# Patient Record
Sex: Male | Born: 1945 | Hispanic: Yes | Marital: Married | Smoking: Never smoker
Health system: Southern US, Community
[De-identification: ages and names within clinical notes are randomized; demographics above are authoritative.]

## PROBLEM LIST (undated history)

## (undated) DIAGNOSIS — N400 Enlarged prostate without lower urinary tract symptoms: Secondary | ICD-10-CM

## (undated) DIAGNOSIS — R7303 Prediabetes: Secondary | ICD-10-CM

## (undated) DIAGNOSIS — R030 Elevated blood-pressure reading, without diagnosis of hypertension: Secondary | ICD-10-CM

## (undated) HISTORY — PX: KNEE ARTHROSCOPY: SHX127

## (undated) HISTORY — DX: Benign prostatic hyperplasia without lower urinary tract symptoms: N40.0

## (undated) HISTORY — DX: Elevated blood-pressure reading, without diagnosis of hypertension: R03.0

---

## 2000-07-31 ENCOUNTER — Ambulatory Visit (HOSPITAL_COMMUNITY): Admission: RE | Admit: 2000-07-31 | Discharge: 2000-07-31 | Payer: Self-pay | Admitting: *Deleted

## 2000-09-21 ENCOUNTER — Encounter: Payer: Self-pay | Admitting: *Deleted

## 2000-09-21 ENCOUNTER — Encounter: Admission: RE | Admit: 2000-09-21 | Discharge: 2000-09-21 | Payer: Self-pay | Admitting: *Deleted

## 2006-09-18 ENCOUNTER — Ambulatory Visit: Payer: Self-pay | Admitting: Family Medicine

## 2019-07-19 ENCOUNTER — Other Ambulatory Visit: Payer: Self-pay

## 2019-07-19 DIAGNOSIS — Z20822 Contact with and (suspected) exposure to covid-19: Secondary | ICD-10-CM

## 2019-07-20 LAB — NOVEL CORONAVIRUS, NAA: SARS-CoV-2, NAA: DETECTED — AB

## 2020-05-12 ENCOUNTER — Emergency Department (HOSPITAL_COMMUNITY)
Admission: EM | Admit: 2020-05-12 | Discharge: 2020-05-12 | Disposition: A | Discharge status: Home / Self Care | Payer: Self-pay | Attending: Emergency Medicine | Admitting: Emergency Medicine

## 2020-05-12 ENCOUNTER — Encounter (HOSPITAL_COMMUNITY): Payer: Self-pay | Admitting: Emergency Medicine

## 2020-05-12 ENCOUNTER — Emergency Department (HOSPITAL_COMMUNITY): Payer: Self-pay

## 2020-05-12 ENCOUNTER — Other Ambulatory Visit: Payer: Self-pay

## 2020-05-12 DIAGNOSIS — E119 Type 2 diabetes mellitus without complications: Secondary | ICD-10-CM | POA: Insufficient documentation

## 2020-05-12 DIAGNOSIS — Y9389 Activity, other specified: Secondary | ICD-10-CM | POA: Insufficient documentation

## 2020-05-12 DIAGNOSIS — M7041 Prepatellar bursitis, right knee: Secondary | ICD-10-CM | POA: Insufficient documentation

## 2020-05-12 LAB — BASIC METABOLIC PANEL
Anion gap: 6 (ref 5–15)
BUN: 19 mg/dL (ref 8–23)
CO2: 27 mmol/L (ref 22–32)
Calcium: 7.9 mg/dL — ABNORMAL LOW (ref 8.9–10.3)
Chloride: 106 mmol/L (ref 98–111)
Creatinine, Ser: 0.7 mg/dL (ref 0.61–1.24)
GFR calc Af Amer: >60 mL/min (ref >60)
GFR calc non Af Amer: >60 mL/min (ref >60)
Glucose, Bld: 238 mg/dL — ABNORMAL HIGH (ref 70–99)
Potassium: 4.5 mmol/L (ref 3.5–5.1)
Sodium: 139 mmol/L (ref 135–145)

## 2020-05-12 LAB — SYNOVIAL CELL COUNT + DIFF, W/ CRYSTALS
Crystals, Fluid: NONE SEEN
Eosinophils-Synovial: 0 % (ref 0–1)
Lymphocytes-Synovial Fld: 1 % (ref 0–20)
Monocyte-Macrophage-Synovial Fluid: 5 % — ABNORMAL LOW (ref 50–90)
Neutrophil, Synovial: 94 % — ABNORMAL HIGH (ref 0–25)
WBC, Synovial: 46500 /mm3 — ABNORMAL HIGH (ref 0–200)

## 2020-05-12 LAB — CBC WITH DIFFERENTIAL/PLATELET
Abs Immature Granulocytes: 0.08 10*3/uL — ABNORMAL HIGH (ref 0.00–0.07)
Basophils Absolute: 0 10*3/uL (ref 0.0–0.1)
Basophils Relative: 0 %
Eosinophils Absolute: 0 10*3/uL (ref 0.0–0.5)
Eosinophils Relative: 0 %
HCT: 36.5 % — ABNORMAL LOW (ref 39.0–52.0)
Hemoglobin: 11.7 g/dL — ABNORMAL LOW (ref 13.0–17.0)
Immature Granulocytes: 1 %
Lymphocytes Relative: 4 %
Lymphs Abs: 0.4 10*3/uL — ABNORMAL LOW (ref 0.7–4.0)
MCH: 30.7 pg (ref 26.0–34.0)
MCHC: 32.1 g/dL (ref 30.0–36.0)
MCV: 95.8 fL (ref 80.0–100.0)
Monocytes Absolute: 0.5 10*3/uL (ref 0.1–1.0)
Monocytes Relative: 4 %
Neutro Abs: 10.3 10*3/uL — ABNORMAL HIGH (ref 1.7–7.7)
Neutrophils Relative %: 91 %
Platelets: 186 10*3/uL (ref 150–400)
RBC: 3.81 MIL/uL — ABNORMAL LOW (ref 4.22–5.81)
RDW: 14.4 % (ref 11.5–15.5)
WBC: 11.3 10*3/uL — ABNORMAL HIGH (ref 4.0–10.5)
nRBC: 0 % (ref 0.0–0.2)

## 2020-05-12 LAB — HEMOGLOBIN A1C
Hgb A1c MFr Bld: 7.1 % — ABNORMAL HIGH (ref 4.8–5.6)
Mean Plasma Glucose: 157.07 mg/dL

## 2020-05-12 LAB — SEDIMENTATION RATE: Sed Rate: 66 mm/hr — ABNORMAL HIGH (ref 0–16)

## 2020-05-12 MED ORDER — DOXYCYCLINE HYCLATE 100 MG PO CAPS
100.0000 mg | ORAL_CAPSULE | Freq: Two times a day (BID) | ORAL | 0 refills | Status: DC
Start: 2020-05-12 — End: 2020-05-22

## 2020-05-12 MED ORDER — DOXYCYCLINE HYCLATE 100 MG PO TABS
100.0000 mg | ORAL_TABLET | Freq: Once | ORAL | Status: AC
  Administered 2020-05-12: 100 mg via ORAL
  Filled 2020-05-12: qty 1

## 2020-05-12 MED ORDER — AMOXICILLIN 500 MG PO CAPS
500.0000 mg | ORAL_CAPSULE | Freq: Three times a day (TID) | ORAL | 0 refills | Status: DC
Start: 2020-05-12 — End: 2020-05-22

## 2020-05-12 MED ORDER — METFORMIN HCL 500 MG PO TABS
500.0000 mg | ORAL_TABLET | Freq: Two times a day (BID) | ORAL | 2 refills | Status: DC
Start: 2020-05-12 — End: 2023-04-19

## 2020-05-12 MED ORDER — LIDOCAINE-EPINEPHRINE 1 %-1:100000 IJ SOLN
10.0000 mL | Freq: Once | INTRAMUSCULAR | Status: AC
  Administered 2020-05-12: 10 mL
  Filled 2020-05-12: qty 1

## 2020-05-12 NOTE — Progress Notes (Signed)
Orthopedic Tech Progress Note Patient Details:  Worth Kober 1946-07-25 [id number]  Ortho Devices Type of Ortho Device: Knee Immobilizer Ortho Device/Splint Location: lle Ortho Device/Splint Interventions: Application, Ordered   Post Interventions Patient Tolerated: Well Instructions Provided: Care of device   Denelle Capurro A Imer Foxworth 05/12/2020, 6:10 PM

## 2020-05-12 NOTE — Consult Note (Signed)
Discussed patient with ER provider, Dr. Charm Barges.  Patient with 3 days worth of what looks like a septic prepatellar bursitis.  Plan for aspiration, and send for Gram stain, culture, sensitivity, analysis, and begin on oral antibiotics to cover possible MRSA, likely doxycycline, possibly Bactrim.  Knee immobilizer, weight-bear as tolerated, and I can see him in the office tomorrow, or Friday.  Eulas Post, MD

## 2020-05-12 NOTE — ED Provider Notes (Signed)
[facility] EMERGENCY DEPARTMENT Provider Note   CSN: [id number] Arrival date & time: 05/12/20  1109     History Chief Complaint  Patient presents with  . Knee Pain    Nathaniel Parks is a 74 y.o. male.  He has no past medical history.  Complaining of right knee pain for 3 days.  No known trauma.  Says he has no pain at rest but it hurts if he walks on it a lot.  No prior history of similar swelling.  No numbness.  He said he has been using ice to it which helps.  He works in [employer] and does do a lot of walking standing and crawling around on his knees.  No fevers or chills.  The history is provided by the patient. The history is limited by a language barrier. A language interpreter was used (ipad).  Knee Pain Location:  Knee Time since incident:  3 days Injury: no   Knee location:  R knee Pain details:    Quality:  Aching   Radiates to:  Does not radiate   Severity:  Moderate   Onset quality:  Gradual   Progression:  Unchanged Chronicity:  New Dislocation: no   Foreign body present:  No foreign bodies Relieved by:  Nothing Worsened by:  Activity Ineffective treatments:  Ice Associated symptoms: swelling   Associated symptoms: no back pain, no fever and no numbness        No past medical history on file.  There are no problems to display for this patient.   History reviewed. No pertinent surgical history.     No family history on file.  Social History   Tobacco Use  . Smoking status: Never Smoker  Substance Use Topics  . Alcohol use: Not Currently  . Drug use: Never    Home Medications Prior to Admission medications   Medication Sig Start Date End Date Taking? Authorizing Provider  amoxicillin (AMOXIL) 500 MG capsule Take 1 capsule (500 mg total) by mouth 3 (three) times daily. 05/12/20   Terrilee Files, MD  doxycycline (VIBRAMYCIN) 100 MG capsule Take 1 capsule (100 mg total) by mouth 2 (two) times daily. 05/12/20   Terrilee Files, MD  metFORMIN (GLUCOPHAGE) 500 MG tablet Take 1 tablet (500 mg total) by mouth 2 (two) times daily with a meal. 05/12/20   Terrilee Files, MD    Allergies    Patient has no known allergies.  Review of Systems   Review of Systems  Constitutional: Negative for fever.  HENT: Negative for sore throat.   Eyes: Negative for visual disturbance.  Respiratory: Negative for shortness of breath.   Cardiovascular: Negative for chest pain.  Gastrointestinal: Negative for abdominal pain.  Genitourinary: Negative for dysuria.  Musculoskeletal: Negative for back pain.  Skin: Negative for rash.  Neurological: Negative for headaches.    Physical Exam Updated Vital Signs BP 126/65 (BP Location: Left Arm)   Pulse 74   Temp 98.1 F (36.7 C) (Oral)   Resp 14   Ht 5' 4.96" (1.65 m)   Wt 68 kg   SpO2 100%   BMI 24.98 kg/m   Physical Exam Vitals and nursing note reviewed.  Constitutional:      Appearance: He is well-developed.  HENT:     Head: Normocephalic and atraumatic.  Eyes:     Conjunctiva/sclera: Conjunctivae normal.  Cardiovascular:     Rate and Rhythm: Normal rate and regular rhythm.  Heart sounds: No murmur heard.   Pulmonary:     Effort: Pulmonary effort is normal. No respiratory distress.     Breath sounds: Normal breath sounds.  Abdominal:     Palpations: Abdomen is soft.     Tenderness: There is no abdominal tenderness.  Musculoskeletal:        General: Swelling and tenderness present.     Cervical back: Neck supple.     Right lower leg: Edema present.     Comments: Patient's right knee is moderately swollen.  It is erythematous and warm.  Primarily tender over the prepatellar bursa although does have some extension down the medial lateral sides.  No pain with axial loading.  Extensor mechanism intact.  Does have edema over the knee but not extending into the calf no cords appreciated.  Distal pulses and sensation intact.  Skin:    General: Skin is warm  and dry.     Capillary Refill: Capillary refill takes less than 2 seconds.  Neurological:     General: No focal deficit present.     Mental Status: He is alert.     Sensory: No sensory deficit.     Motor: No weakness.       ED Results / Procedures / Treatments   Labs (all labs ordered are listed, but only abnormal results are displayed) Labs Reviewed  BASIC METABOLIC PANEL  CBC WITH DIFFERENTIAL/PLATELET  SEDIMENTATION RATE    EKG None  Radiology DG Knee Complete 4 Views Right  Result Date: 05/12/2020 CLINICAL DATA:  Swelling without trauma EXAM: RIGHT KNEE - COMPLETE 4+ VIEW COMPARISON:  None. FINDINGS: No acute fracture or dislocation. There are mild tricompartmental degenerative changes with joint space narrowing and mild osteophyte formation. Multiple well corticated ossific densities at the posterior joint space likely reflecting fabellae versus sequela of remote prior trauma. No area of erosion or osseous destruction. No unexpected radiopaque foreign body. Soft tissue edema overlying the patella and suprapatellar area without large joint effusion. No subcutaneous air. IMPRESSION: 1. Soft tissue edema overlying the patella and suprapatellar area without large joint effusion. 2. No acute fracture or dislocation. 3. No radiographic evidence of osteomyelitis. Electronically Signed   By: Meda Klinefelter MD   On: 05/12/2020 12:16    Procedures .Joint Aspiration/Arthrocentesis  Date/Time: 05/12/2020 5:37 PM Performed by: Terrilee Files, MD Authorized by: Terrilee Files, MD   Consent:    Consent obtained:  Written   Consent given by:  Patient   Risks discussed:  Bleeding, infection and pain   Alternatives discussed:  No treatment and delayed treatment Location:    Location:  Knee   Knee:  R knee Anesthesia (see MAR for exact dosages):    Anesthesia method:  Local infiltration   Local anesthetic:  Lidocaine 2% WITH epi Procedure details:    Preparation: Patient  was prepped and draped in usual sterile fashion     Needle gauge:  18 G   Ultrasound guidance: no     Approach:  Superior   Aspirate amount:  40   Aspirate characteristics:  Cloudy   Steroid injected: no     Specimen collected: yes   Post-procedure details:    Dressing:  Gauze roll   Patient tolerance of procedure:  Tolerated well, no immediate complications Comments:     Prepatellar bursa aspiration   (including critical care time)  Medications Ordered in ED Medications  lidocaine-EPINEPHrine (XYLOCAINE W/EPI) 1 %-1:100000 (with pres) injection 10 mL (10 mLs Infiltration Given  by Other 05/12/20 1733)  doxycycline (VIBRA-TABS) tablet 100 mg (100 mg Oral Given 05/12/20 1832)    ED Course  I have reviewed the triage vital signs and the nursing notes.  Pertinent labs & imaging results that were available during my care of the patient were reviewed by me and considered in my medical decision making (see chart for details).  Clinical Course as of May 13 1018  Tue May 12, 2020  1700 D[facility]ed with Dr. [facility].  He recommended tapping the prepatellar bursa and getting out as much fluid as possible and sending it off for crystals and culture.  Doxycycline.  Knee immobilizer.  Can follow-up in the office either tomorrow or Friday.   [MB]  1806 Patient's labs coming back with an elevated glucose of 238.  Not known to be diabetic.  This is probably chronic condition although it may reflect some normal extra stress on the system with this infection.  Talked with transitions of care and they will try to get [facility].  We will start him on some Metformin.   [MB]    Clinical Course User Index [MB] Terrilee FilesButler, Nikolaos Maddocks C, MD   MDM Rules/Calculators/A&P                         This patient complains of right knee pain and swelling; this involves an extensive number of treatment Options and is a complaint that carries with it a high risk of complications  and Morbidity. The differential includes septic knee, gout, bursitis, cellulitis  I ordered, reviewed and interpreted labs, which included CBC with mildly elevated white count of 11, mildly low hemoglobin of 11.7, chemistry with elevated glucose and low calcium, no priors in system. I ordered medication doxycycline I ordered imaging studies which included x-ray right knee and I independently    visualized and interpreted imaging which showed degenerative changes and soft tissue swelling Previous records obtained and reviewed in epic, none I consulted Dr. [facility] and discussed lab and imaging findings  Critical Interventions: None  After the interventions stated above, I reevaluated the patient and found patient to be afebrile and minimally symptomatic.  Removed about 40 cc of cloudy tan fluid from prepatellar bursa.  Patient clinically not exhibiting symptoms of septic knee joint as has minimal pain with range of motion and axial loading.  Also not systemically ill.  Will cover with antibiotics and knee immobilizer with close follow-up with orthopedics tomorrow.  Also have asked social work to get him a primary care doctor appointment as he is likely new onset diabetic.  Added on A1c.  Reviewed all of this with the patient via Spanish interpreter iPad.  Return instructions discussed.   Final Clinical Impression(s) / ED Diagnoses Final diagnoses:  Prepatellar bursitis of right knee  Diabetes mellitus, new onset (HCC)    Rx / DC Orders ED Discharge Orders         Ordered    doxycycline (VIBRAMYCIN) 100 MG capsule  2 times daily     Discontinue  Reprint     05/12/20 1739    metFORMIN (GLUCOPHAGE) 500 MG tablet  2 times daily with meals     Discontinue  Reprint     05/12/20 1808    amoxicillin (AMOXIL) 500 MG capsule  3 times daily     Discontinue  Reprint     05/12/20 1855  Terrilee Files, MD 05/13/20 1024

## 2020-05-12 NOTE — ED Triage Notes (Signed)
Pt reports 3 days right knee swelling. Denies recent injury. Knee red/ hot to touch. PT ambulatory with limp.

## 2020-05-12 NOTE — Discharge Instructions (Addendum)
You were seen in the emergency department for swelling around your right knee.  Your x-ray did not show any fracture.  We drained some fluid of to send to the laboratory and you were started on antibiotics.  Please also take ibuprofen three times a day.  Elevate leg.  Call Dr. Shelba Flake office tomorrow to be seen in the clinic.  Return to the emergency department if any fever or worsening symptoms.

## 2020-05-12 NOTE — Social Work (Signed)
TOC team will reach out to [facility] in order to connect Pt with PCP. CSW also added [facility] to Pt's AVS

## 2020-05-13 ENCOUNTER — Telehealth: Payer: Self-pay

## 2020-05-13 ENCOUNTER — Encounter (HOSPITAL_COMMUNITY): Payer: Self-pay | Admitting: Emergency Medicine

## 2020-05-13 LAB — GLUCOSE, BODY FLUID OTHER: Glucose, Body Fluid Other: 45 mg/dL

## 2020-05-13 LAB — PROTEIN, BODY FLUID (OTHER): Total Protein, Body Fluid Other: 1.3 g/dL

## 2020-05-13 NOTE — Telephone Encounter (Signed)
CSW called Pt's cell phone [phone] with the assistance of Copy services employee # [phone]. CSW was able to give details of Pt's appointment at [facility] on 05/18/20 at 2:40pm

## 2020-05-14 ENCOUNTER — Other Ambulatory Visit (HOSPITAL_COMMUNITY): Admission: RE | Admit: 2020-05-14 | Payer: Self-pay | Source: Ambulatory Visit

## 2020-05-14 LAB — BODY FLUID CULTURE

## 2020-05-14 LAB — PATHOLOGIST SMEAR REVIEW

## 2020-05-16 ENCOUNTER — Telehealth: Payer: Self-pay | Admitting: Emergency Medicine

## 2020-05-16 NOTE — Telephone Encounter (Signed)
Post ED Visit - Positive Culture Follow-up  Culture report reviewed by antimicrobial stewardship pharmacist: [facility] []  , Pharm.D. []  Enzo Bi, Pharm.D., BCPS AQ-ID []  , Pharm.D., BCPS []  Celedonio Miyamoto, Pharm.D., BCPS []  [place], Garvin Fila.D., BCPS, AAHIVP []  , Pharm.D., BCPS, AAHIVP []  Georgina Pillion, PharmD, BCPS []  , PharmD, BCPS []  [place], PharmD, BCPS [x]  [place], PharmD []  , PharmD, BCPS []  Estella Husk, PharmD  Pharmacy Team []  Lysle Pearl, PharmD []  , PharmD []  Phillips Climes, PharmD []  , Rph []  Agapito Games) , PharmD []  Leia Alf, PharmD []  , PharmD []  Mervyn Gay, PharmD []  , PharmD []  Vinnie Level, PharmD []  Wonda Olds, PharmD []  , PharmD []  Len Childs, PharmD   Positive body fluid  culture Treated with Doxycycline and Amoxicillin, organism sensitive to the same and no further patient follow-up is required at this time.  Nechemia Chiappetta 05/16/2020, 10:36 AM

## 2020-05-18 ENCOUNTER — Inpatient Hospital Stay (HOSPITAL_COMMUNITY)
Admission: RE | Admit: 2020-05-18 | Discharge: 2020-05-22 | DRG: 501 | Disposition: A | Discharge status: Home / Self Care | Payer: Self-pay | Source: Ambulatory Visit | Attending: Orthopedic Surgery | Admitting: Orthopedic Surgery

## 2020-05-18 ENCOUNTER — Other Ambulatory Visit: Payer: Self-pay

## 2020-05-18 ENCOUNTER — Inpatient Hospital Stay (INDEPENDENT_AMBULATORY_CARE_PROVIDER_SITE_OTHER): Payer: Self-pay | Admitting: Primary Care

## 2020-05-18 DIAGNOSIS — M71161 Other infective bursitis, right knee: Principal | ICD-10-CM | POA: Diagnosis present

## 2020-05-18 DIAGNOSIS — E119 Type 2 diabetes mellitus without complications: Secondary | ICD-10-CM | POA: Diagnosis present

## 2020-05-18 DIAGNOSIS — Z794 Long term (current) use of insulin: Secondary | ICD-10-CM

## 2020-05-18 DIAGNOSIS — L03115 Cellulitis of right lower limb: Secondary | ICD-10-CM | POA: Diagnosis present

## 2020-05-18 DIAGNOSIS — B9561 Methicillin susceptible Staphylococcus aureus infection as the cause of diseases classified elsewhere: Secondary | ICD-10-CM | POA: Diagnosis present

## 2020-05-18 DIAGNOSIS — Z20822 Contact with and (suspected) exposure to covid-19: Secondary | ICD-10-CM | POA: Diagnosis present

## 2020-05-18 HISTORY — DX: Prediabetes: R73.03

## 2020-05-18 LAB — BASIC METABOLIC PANEL
Anion gap: 9 (ref 5–15)
BUN: 29 mg/dL — ABNORMAL HIGH (ref 8–23)
CO2: 26 mmol/L (ref 22–32)
Calcium: 8.6 mg/dL — ABNORMAL LOW (ref 8.9–10.3)
Chloride: 99 mmol/L (ref 98–111)
Creatinine, Ser: 1.27 mg/dL — ABNORMAL HIGH (ref 0.61–1.24)
GFR calc Af Amer: >60 mL/min (ref >60)
GFR calc non Af Amer: 55 mL/min — ABNORMAL LOW (ref >60)
Glucose, Bld: 194 mg/dL — ABNORMAL HIGH (ref 70–99)
Potassium: 5.1 mmol/L (ref 3.5–5.1)
Sodium: 134 mmol/L — ABNORMAL LOW (ref 135–145)

## 2020-05-18 LAB — CBC
HCT: 37.4 % — ABNORMAL LOW (ref 39.0–52.0)
Hemoglobin: 12.1 g/dL — ABNORMAL LOW (ref 13.0–17.0)
MCH: 30.3 pg (ref 26.0–34.0)
MCHC: 32.4 g/dL (ref 30.0–36.0)
MCV: 93.7 fL (ref 80.0–100.0)
Platelets: 294 10*3/uL (ref 150–400)
RBC: 3.99 MIL/uL — ABNORMAL LOW (ref 4.22–5.81)
RDW: 13.6 % (ref 11.5–15.5)
WBC: 8.7 10*3/uL (ref 4.0–10.5)
nRBC: 0 % (ref 0.0–0.2)

## 2020-05-18 LAB — SURGICAL PCR SCREEN
MRSA, PCR: NEGATIVE
Staphylococcus aureus: NEGATIVE

## 2020-05-18 LAB — SARS CORONAVIRUS 2 BY RT PCR (HOSPITAL ORDER, PERFORMED IN ~~LOC~~ HOSPITAL LAB): SARS Coronavirus 2: NEGATIVE

## 2020-05-18 MED ORDER — HYDROCODONE-ACETAMINOPHEN 7.5-325 MG PO TABS
1.0000 | ORAL_TABLET | ORAL | Status: DC | PRN
  Administered 2020-05-19: 1 via ORAL
  Filled 2020-05-18: qty 1

## 2020-05-18 MED ORDER — CEFAZOLIN SODIUM-DEXTROSE 2-4 GM/100ML-% IV SOLN
2.0000 g | INTRAVENOUS | Status: AC
  Administered 2020-05-19: 2 g via INTRAVENOUS
  Filled 2020-05-18: qty 100

## 2020-05-18 MED ORDER — ASPIRIN 81 MG PO CHEW
81.0000 mg | CHEWABLE_TABLET | Freq: Two times a day (BID) | ORAL | Status: DC
  Administered 2020-05-18 – 2020-05-22 (×8): 81 mg via ORAL
  Filled 2020-05-18 (×8): qty 1

## 2020-05-18 MED ORDER — ENSURE PRE-SURGERY PO LIQD
296.0000 mL | Freq: Once | ORAL | Status: AC
  Administered 2020-05-19: 296 mL via ORAL
  Filled 2020-05-18: qty 296

## 2020-05-18 MED ORDER — CHLORHEXIDINE GLUCONATE 4 % EX LIQD
60.0000 mL | Freq: Once | CUTANEOUS | Status: AC
  Administered 2020-05-19: 4 via TOPICAL
  Filled 2020-05-18: qty 60

## 2020-05-18 MED ORDER — MORPHINE SULFATE (PF) 2 MG/ML IV SOLN: 0.5000 mg | INTRAVENOUS | Status: DC | PRN

## 2020-05-18 MED ORDER — POTASSIUM CHLORIDE IN NACL 20-0.45 MEQ/L-% IV SOLN
INTRAVENOUS | Status: DC
  Filled 2020-05-18 (×3): qty 1000

## 2020-05-18 MED ORDER — DIPHENHYDRAMINE HCL 12.5 MG/5ML PO ELIX: 12.5000 mg | ORAL_SOLUTION | ORAL | Status: DC | PRN

## 2020-05-18 MED ORDER — ONDANSETRON HCL 4 MG PO TABS: 4.0000 mg | ORAL_TABLET | Freq: Four times a day (QID) | ORAL | Status: DC | PRN

## 2020-05-18 MED ORDER — ACETAMINOPHEN 500 MG PO TABS
1000.0000 mg | ORAL_TABLET | Freq: Once | ORAL | Status: AC
  Administered 2020-05-18: 1000 mg via ORAL
  Filled 2020-05-18: qty 2

## 2020-05-18 MED ORDER — METHOCARBAMOL 1000 MG/10ML IJ SOLN
500.0000 mg | Freq: Four times a day (QID) | INTRAVENOUS | Status: DC | PRN
  Filled 2020-05-18: qty 5

## 2020-05-18 MED ORDER — MUPIROCIN 2 % EX OINT
1.0000 "application " | TOPICAL_OINTMENT | Freq: Two times a day (BID) | CUTANEOUS | Status: DC
  Administered 2020-05-18 – 2020-05-20 (×3): 1 via NASAL
  Filled 2020-05-18: qty 22

## 2020-05-18 MED ORDER — MAGNESIUM CITRATE PO SOLN: 1.0000 | Freq: Once | ORAL | Status: DC | PRN

## 2020-05-18 MED ORDER — ONDANSETRON HCL 4 MG/2ML IJ SOLN
4.0000 mg | Freq: Four times a day (QID) | INTRAMUSCULAR | Status: DC | PRN
  Administered 2020-05-19: 4 mg via INTRAVENOUS

## 2020-05-18 MED ORDER — VANCOMYCIN HCL IN DEXTROSE 1-5 GM/200ML-% IV SOLN
1000.0000 mg | Freq: Once | INTRAVENOUS | Status: AC
  Administered 2020-05-18: 1000 mg via INTRAVENOUS
  Filled 2020-05-18: qty 200

## 2020-05-18 MED ORDER — METHOCARBAMOL 500 MG PO TABS: 500.0000 mg | ORAL_TABLET | Freq: Four times a day (QID) | ORAL | Status: DC | PRN

## 2020-05-18 MED ORDER — SENNOSIDES-DOCUSATE SODIUM 8.6-50 MG PO TABS: 1.0000 | ORAL_TABLET | Freq: Every evening | ORAL | Status: DC | PRN

## 2020-05-18 MED ORDER — HYDROCODONE-ACETAMINOPHEN 5-325 MG PO TABS: 1.0000 | ORAL_TABLET | ORAL | Status: DC | PRN

## 2020-05-18 MED ORDER — BISACODYL 5 MG PO TBEC: 5.0000 mg | DELAYED_RELEASE_TABLET | Freq: Every day | ORAL | Status: DC | PRN

## 2020-05-18 MED ORDER — VANCOMYCIN HCL 750 MG/150ML IV SOLN
750.0000 mg | Freq: Two times a day (BID) | INTRAVENOUS | Status: DC
  Administered 2020-05-18: 750 mg via INTRAVENOUS
  Filled 2020-05-18 (×2): qty 150

## 2020-05-18 MED ORDER — POVIDONE-IODINE 10 % EX SWAB
2.0000 "application " | Freq: Once | CUTANEOUS | Status: AC
  Administered 2020-05-19: 2 via TOPICAL

## 2020-05-18 MED ORDER — ACETAMINOPHEN 325 MG PO TABS: 325.0000 mg | ORAL_TABLET | Freq: Four times a day (QID) | ORAL | Status: DC | PRN

## 2020-05-18 NOTE — Plan of Care (Signed)
?  Problem: Clinical Measurements: ?Goal: Ability to avoid or minimize complications of infection will improve ?Outcome: Progressing ?  ?Problem: Skin Integrity: ?Goal: Skin integrity will improve ?Outcome: Progressing ?  ?

## 2020-05-18 NOTE — H&P (Signed)
H&P  Chief Complaint: Right knee pain  HPI: Nathaniel Parks is a 74 y.o. male with no past medical history presented to the ED on 05/12/20 with 3 day history of right knee pain and swelling. Orthopedics was consulted, knee was aspirated and sent for gram stain, culture, sensitivity, and analysis. He was discharged on doxycycline and told to follow up in our office. He came into our office on 7/21 for follow-up where a bedside prepatellar bursectomy was performed. Patient was continued on doxycycline and bactrim was added. He followed up 7/23 showing improvement, however, followed up today in the office with worsening symptoms. Patient states he has no pain at rest but increased 10/10 pain with any walking, only decreased with tramadol. He has also noticed increased redness form his mid thigh to his ankle and increased swelling in his lower leg and ankle, improved with elevation.   He denies fever, chills, abdominal pain, nausea, vomiting, chest pain, or shortness of breath.   Patient was direct admitted to the hospital for IV antibiotics and irrigation and debridement to be performed tomorrow.    No past medical history on file. No past surgical history on file. Social History   Socioeconomic History  . Marital status: Married    Spouse name: Not on file  . Number of children: Not on file  . Years of education: Not on file  . Highest education level: Not on file  Occupational History  . Not on file  Tobacco Use  . Smoking status: Never Smoker  Substance and Sexual Activity  . Alcohol use: Not Currently  . Drug use: Never  . Sexual activity: Not on file  Other Topics Concern  . Not on file  Social History Narrative  . Not on file   Social Determinants of Health   Financial Resource Strain:   . Difficulty of Paying Living Expenses:   Food Insecurity:   . Worried About Programme researcher, broadcasting/film/video in the Last Year:   . Barista in the Last Year:   Transportation Needs:   . Automotive engineer (Medical):   Marland Kitchen Lack of Transportation (Non-Medical):   Physical Activity:   . Days of Exercise per Week:   . Minutes of Exercise per Session:   Stress:   . Feeling of Stress :   Social Connections:   . Frequency of Communication with Friends and Family:   . Frequency of Social Gatherings with Friends and Family:   . Attends Religious Services:   . Active Member of Clubs or Organizations:   . Attends Banker Meetings:   Marland Kitchen Marital Status:    No family history on file. No Known Allergies Prior to Admission medications   Medication Sig Start Date End Date Taking? Authorizing Provider  BACTRIM DS 800-160 MG tablet Take 1 tablet by mouth 2 (two) times daily. 05/13/20  Yes [provider]  doxycycline (VIBRAMYCIN) 100 MG capsule Take 1 capsule (100 mg total) by mouth 2 (two) times daily. 05/12/20  Yes Terrilee Files, MD  traMADol (ULTRAM) 50 MG tablet Take 50 mg by mouth 3 (three) times daily as needed for pain. 05/15/20  Yes [provider]  amoxicillin (AMOXIL) 500 MG capsule Take 1 capsule (500 mg total) by mouth 3 (three) times daily. Patient not taking: Reported on 05/18/2020 05/12/20   Terrilee Files, MD  metFORMIN (GLUCOPHAGE) 500 MG tablet Take 1 tablet (500 mg total) by mouth 2 (two) times daily with a meal.  05/12/20   Terrilee Files, MD     Positive ROS: All other systems have been reviewed and were otherwise negative with the exception of those mentioned in the HPI and as above.  Physical Exam: General: Alert, no acute distress Cardiovascular: No pedal edema Respiratory: No cyanosis, no use of accessory musculature GI: No organomegaly, abdomen is soft and non-tender Skin: Mottled erythematous skin extending from mid thigh to ankle on RLE.  Neurologic: Sensation intact distally Psychiatric: Patient is competent for consent with normal mood and affect Lymphatic: No axillary or cervical lymphadenopathy  MUSCULOSKELETAL: Open  wound over right knee from previous open bursal irrigation and debridement with continued drainage. RLE erythematous from mid thigh to ankle. Moderate edema to RLE, especially at ankle. EHL and FHL intact. Able to move all toes of right foot. Distal sensation intact.  Previous R knee synovial fluid culture positive for S. Aureus   Assessment: R knee septic prepatellar bursitis - S. Aureus  Plan: - NPO after midnight, plan for R knee bursectomy/irrigation and debridement with Dr. Dion Saucier tomorrow afternoon - continue current pain control regimen - IV vancomycin per pharmacy consult for S. Aureus septic bursitis - patient will need to be discharged on PICC line for IV antibiotics  Plan for Procedure(s): KNEE BURSECTOMY IRRIGATION AND DEBRIDEMENT KNEE  The risks benefits and alternatives were discussed with the patient including but not limited to the risks of nonoperative treatment, versus surgical intervention including infection, bleeding, nerve injury,  blood clots, cardiopulmonary complications, morbidity, mortality, among others, and they were willing to proceed.   Anticipated LOS equal to or greater than 2 midnights due to - Age 29 and older with one or more of the following:  - Expected need for hospital services (PT, OT, Nursing) required for safe discharge  - Need for IV antibiotics   Armida Sans, PA-C   05/18/2020 2:11 PM

## 2020-05-18 NOTE — Progress Notes (Signed)
In pt room with interpreter line to explain surgical procedure. Pt states he wishes to speak with MD before signing consent. Unsigned consent in pt chart

## 2020-05-18 NOTE — Progress Notes (Signed)
Pharmacy Antibiotic Note  Nathaniel Parks is a 74 y.o. male admitted on 05/18/2020 with septic bursitis .  Pharmacy has been consulted for Vancomycin dosing.  Surgery planned for tomorrow  Plan: Vancomycin 1 gram iv x 1 now then 750 mg iv Q 12  Height: 5' 4.96" (165 cm) Weight: 72.9 kg (160 lb 11.5 oz) IBW/kg (Calculated) : 61.41  Temp (24hrs), Avg:98.6 F (37 C), Min:98.6 F (37 C), Max:98.6 F (37 C)  Recent Labs  Lab 05/12/20 1702  WBC 11.3*  CREATININE 0.70    Estimated Creatinine Clearance: 70.4 mL/min (by C-G formula based on SCr of 0.7 mg/dL).    No Known Allergies   Thank you  Okey Regal, PharmD  05/18/2020 2:23 PM

## 2020-05-18 NOTE — Plan of Care (Signed)
  Problem: Clinical Measurements: Goal: Ability to avoid or minimize complications of infection will improve 05/18/2020 1441 by Marland Mcalpine, RN Outcome: Progressing 05/18/2020 1441 by Marland Mcalpine, RN Outcome: Progressing   Problem: Skin Integrity: Goal: Skin integrity will improve 05/18/2020 1441 by Marland Mcalpine, RN Outcome: Progressing 05/18/2020 1441 by Marland Mcalpine, RN Outcome: Progressing

## 2020-05-19 ENCOUNTER — Encounter (HOSPITAL_COMMUNITY): Payer: Self-pay | Admitting: Orthopedic Surgery

## 2020-05-19 ENCOUNTER — Inpatient Hospital Stay (HOSPITAL_COMMUNITY): Payer: Self-pay | Admitting: Anesthesiology

## 2020-05-19 ENCOUNTER — Encounter (HOSPITAL_COMMUNITY)
Admission: RE | Disposition: A | Discharge status: Home / Self Care | Payer: Self-pay | Source: Ambulatory Visit | Attending: Orthopedic Surgery

## 2020-05-19 HISTORY — PX: KNEE BURSECTOMY: SHX5882

## 2020-05-19 HISTORY — PX: IRRIGATION AND DEBRIDEMENT KNEE: SHX5185

## 2020-05-19 LAB — GLUCOSE, CAPILLARY
Glucose-Capillary: 169 mg/dL — ABNORMAL HIGH (ref 70–99)
Glucose-Capillary: 425 mg/dL — ABNORMAL HIGH (ref 70–99)
Glucose-Capillary: 328 mg/dL — ABNORMAL HIGH (ref 70–99)
Glucose-Capillary: 202 mg/dL — ABNORMAL HIGH (ref 70–99)

## 2020-05-19 LAB — HEMOGLOBIN A1C
Hgb A1c MFr Bld: 7.7 % — ABNORMAL HIGH (ref 4.8–5.6)
Mean Plasma Glucose: 174.29 mg/dL

## 2020-05-19 SURGERY — BURSECTOMY, KNEE
Anesthesia: General | Site: Knee | Laterality: Right

## 2020-05-19 MED ORDER — BISACODYL 10 MG RE SUPP: 10.0000 mg | Freq: Every day | RECTAL | Status: DC | PRN

## 2020-05-19 MED ORDER — MORPHINE SULFATE (PF) 2 MG/ML IV SOLN
0.5000 mg | INTRAVENOUS | Status: DC | PRN
  Administered 2020-05-21 (×2): 1 mg via INTRAVENOUS
  Filled 2020-05-19 (×2): qty 1

## 2020-05-19 MED ORDER — METOCLOPRAMIDE HCL 5 MG PO TABS: 5.0000 mg | ORAL_TABLET | Freq: Three times a day (TID) | ORAL | Status: DC | PRN

## 2020-05-19 MED ORDER — HYDROCODONE-ACETAMINOPHEN 5-325 MG PO TABS
1.0000 | ORAL_TABLET | ORAL | Status: DC | PRN
  Administered 2020-05-21 (×2): 2 via ORAL
  Administered 2020-05-22 (×2): 1 via ORAL
  Filled 2020-05-19: qty 1
  Filled 2020-05-19 (×2): qty 2
  Filled 2020-05-19: qty 1

## 2020-05-19 MED ORDER — LIDOCAINE HCL (CARDIAC) PF 100 MG/5ML IV SOSY
PREFILLED_SYRINGE | INTRAVENOUS | Status: DC | PRN
  Administered 2020-05-19: 60 mg via INTRAVENOUS

## 2020-05-19 MED ORDER — METHOCARBAMOL 500 MG PO TABS
500.0000 mg | ORAL_TABLET | Freq: Four times a day (QID) | ORAL | Status: DC | PRN
  Administered 2020-05-21: 500 mg via ORAL
  Filled 2020-05-19: qty 1

## 2020-05-19 MED ORDER — INSULIN ASPART 100 UNIT/ML ~~LOC~~ SOLN
0.0000 [IU] | Freq: Three times a day (TID) | SUBCUTANEOUS | Status: DC
  Administered 2020-05-19: 10 [IU] via SUBCUTANEOUS
  Administered 2020-05-20: 3 [IU] via SUBCUTANEOUS
  Administered 2020-05-20: 2 [IU] via SUBCUTANEOUS
  Administered 2020-05-20: 3 [IU] via SUBCUTANEOUS
  Administered 2020-05-21 (×2): 2 [IU] via SUBCUTANEOUS
  Administered 2020-05-21: 5 [IU] via SUBCUTANEOUS
  Administered 2020-05-22: 3 [IU] via SUBCUTANEOUS

## 2020-05-19 MED ORDER — POTASSIUM CHLORIDE IN NACL 20-0.45 MEQ/L-% IV SOLN
INTRAVENOUS | Status: DC
  Filled 2020-05-19 (×5): qty 1000

## 2020-05-19 MED ORDER — MIDAZOLAM HCL 2 MG/2ML IJ SOLN
INTRAMUSCULAR | Status: AC
  Filled 2020-05-19: qty 2

## 2020-05-19 MED ORDER — HYDROCODONE-ACETAMINOPHEN 7.5-325 MG PO TABS: 1.0000 | ORAL_TABLET | ORAL | Status: DC | PRN

## 2020-05-19 MED ORDER — DEXAMETHASONE SODIUM PHOSPHATE 10 MG/ML IJ SOLN
INTRAMUSCULAR | Status: DC | PRN
  Administered 2020-05-19: 10 mg via INTRAVENOUS

## 2020-05-19 MED ORDER — METHOCARBAMOL 1000 MG/10ML IJ SOLN
500.0000 mg | Freq: Four times a day (QID) | INTRAVENOUS | Status: DC | PRN
  Filled 2020-05-19: qty 5

## 2020-05-19 MED ORDER — POLYETHYLENE GLYCOL 3350 17 G PO PACK
17.0000 g | PACK | Freq: Every day | ORAL | Status: DC | PRN
  Administered 2020-05-20 – 2020-05-22 (×2): 17 g via ORAL
  Filled 2020-05-19 (×2): qty 1

## 2020-05-19 MED ORDER — ACETAMINOPHEN 325 MG PO TABS: 325.0000 mg | ORAL_TABLET | Freq: Four times a day (QID) | ORAL | Status: DC | PRN

## 2020-05-19 MED ORDER — SENNA 8.6 MG PO TABS
1.0000 | ORAL_TABLET | Freq: Two times a day (BID) | ORAL | Status: DC
  Administered 2020-05-19 – 2020-05-22 (×6): 8.6 mg via ORAL
  Filled 2020-05-19 (×6): qty 1

## 2020-05-19 MED ORDER — LIDOCAINE 2% (20 MG/ML) 5 ML SYRINGE
INTRAMUSCULAR | Status: AC
  Filled 2020-05-19: qty 5

## 2020-05-19 MED ORDER — FENTANYL CITRATE (PF) 100 MCG/2ML IJ SOLN
INTRAMUSCULAR | Status: AC
  Filled 2020-05-19: qty 2

## 2020-05-19 MED ORDER — ONDANSETRON HCL 4 MG PO TABS: 4.0000 mg | ORAL_TABLET | Freq: Four times a day (QID) | ORAL | Status: DC | PRN

## 2020-05-19 MED ORDER — ONDANSETRON HCL 4 MG/2ML IJ SOLN: 4.0000 mg | Freq: Four times a day (QID) | INTRAMUSCULAR | Status: DC | PRN

## 2020-05-19 MED ORDER — METOCLOPRAMIDE HCL 5 MG/ML IJ SOLN: 5.0000 mg | Freq: Three times a day (TID) | INTRAMUSCULAR | Status: DC | PRN

## 2020-05-19 MED ORDER — OXYCODONE HCL 5 MG PO TABS: 5.0000 mg | ORAL_TABLET | Freq: Once | ORAL | Status: DC | PRN

## 2020-05-19 MED ORDER — INSULIN ASPART 100 UNIT/ML ~~LOC~~ SOLN
10.0000 [IU] | Freq: Once | SUBCUTANEOUS | Status: AC
  Administered 2020-05-19: 10 [IU] via SUBCUTANEOUS

## 2020-05-19 MED ORDER — CHLORHEXIDINE GLUCONATE 0.12 % MT SOLN: 15.0000 mL | Freq: Once | OROMUCOSAL | Status: AC

## 2020-05-19 MED ORDER — DOCUSATE SODIUM 100 MG PO CAPS
100.0000 mg | ORAL_CAPSULE | Freq: Two times a day (BID) | ORAL | Status: DC
  Administered 2020-05-19 – 2020-05-20 (×3): 100 mg via ORAL
  Filled 2020-05-19 (×3): qty 1

## 2020-05-19 MED ORDER — PROPOFOL 10 MG/ML IV BOLUS
INTRAVENOUS | Status: DC | PRN
  Administered 2020-05-19: 150 mg via INTRAVENOUS

## 2020-05-19 MED ORDER — CHLORHEXIDINE GLUCONATE 0.12 % MT SOLN
OROMUCOSAL | Status: AC
  Administered 2020-05-19: 15 mL via OROMUCOSAL
  Filled 2020-05-19: qty 15

## 2020-05-19 MED ORDER — FENTANYL CITRATE (PF) 250 MCG/5ML IJ SOLN
INTRAMUSCULAR | Status: AC
  Filled 2020-05-19: qty 5

## 2020-05-19 MED ORDER — PHENYLEPHRINE HCL (PRESSORS) 10 MG/ML IV SOLN
INTRAVENOUS | Status: DC | PRN
  Administered 2020-05-19 (×2): 80 ug via INTRAVENOUS

## 2020-05-19 MED ORDER — ORAL CARE MOUTH RINSE: 15.0000 mL | Freq: Once | OROMUCOSAL | Status: AC

## 2020-05-19 MED ORDER — MIDAZOLAM HCL 5 MG/5ML IJ SOLN
INTRAMUSCULAR | Status: DC | PRN
  Administered 2020-05-19: 1 mg via INTRAVENOUS

## 2020-05-19 MED ORDER — PROPOFOL 1000 MG/100ML IV EMUL
INTRAVENOUS | Status: AC
  Filled 2020-05-19: qty 100

## 2020-05-19 MED ORDER — KETOROLAC TROMETHAMINE 15 MG/ML IJ SOLN
7.5000 mg | Freq: Four times a day (QID) | INTRAMUSCULAR | Status: AC
  Administered 2020-05-19 – 2020-05-20 (×4): 7.5 mg via INTRAVENOUS
  Filled 2020-05-19 (×4): qty 1

## 2020-05-19 MED ORDER — DIPHENHYDRAMINE HCL 12.5 MG/5ML PO ELIX: 12.5000 mg | ORAL_SOLUTION | ORAL | Status: DC | PRN

## 2020-05-19 MED ORDER — MAGNESIUM CITRATE PO SOLN: 1.0000 | Freq: Once | ORAL | Status: DC | PRN

## 2020-05-19 MED ORDER — CEFAZOLIN SODIUM-DEXTROSE 1-4 GM/50ML-% IV SOLN
1.0000 g | Freq: Three times a day (TID) | INTRAVENOUS | Status: DC
  Filled 2020-05-19: qty 50

## 2020-05-19 MED ORDER — METFORMIN HCL 500 MG PO TABS
500.0000 mg | ORAL_TABLET | Freq: Two times a day (BID) | ORAL | Status: DC
  Administered 2020-05-19 – 2020-05-22 (×7): 500 mg via ORAL
  Filled 2020-05-19 (×7): qty 1

## 2020-05-19 MED ORDER — PHENYLEPHRINE 40 MCG/ML (10ML) SYRINGE FOR IV PUSH (FOR BLOOD PRESSURE SUPPORT)
PREFILLED_SYRINGE | INTRAVENOUS | Status: AC
  Filled 2020-05-19: qty 20

## 2020-05-19 MED ORDER — FENTANYL CITRATE (PF) 100 MCG/2ML IJ SOLN
25.0000 ug | INTRAMUSCULAR | Status: DC | PRN
  Administered 2020-05-19 (×3): 50 ug via INTRAVENOUS

## 2020-05-19 MED ORDER — LACTATED RINGERS IV SOLN: INTRAVENOUS | Status: DC

## 2020-05-19 MED ORDER — SODIUM CHLORIDE 0.9 % IR SOLN
Status: DC | PRN
  Administered 2020-05-19: 1000 mL
  Administered 2020-05-19 (×3): 3000 mL

## 2020-05-19 MED ORDER — FENTANYL CITRATE (PF) 250 MCG/5ML IJ SOLN
INTRAMUSCULAR | Status: DC | PRN
  Administered 2020-05-19 (×3): 50 ug via INTRAVENOUS

## 2020-05-19 MED ORDER — CEFAZOLIN SODIUM-DEXTROSE 2-4 GM/100ML-% IV SOLN
2.0000 g | Freq: Four times a day (QID) | INTRAVENOUS | Status: AC
  Administered 2020-05-19 – 2020-05-20 (×3): 2 g via INTRAVENOUS
  Filled 2020-05-19 (×3): qty 100

## 2020-05-19 MED ORDER — OXYCODONE HCL 5 MG/5ML PO SOLN: 5.0000 mg | Freq: Once | ORAL | Status: DC | PRN

## 2020-05-19 SURGICAL SUPPLY — 41 items
BNDG COHESIVE 4X5 TAN STRL (GAUZE/BANDAGES/DRESSINGS) ×2 IMPLANT
BNDG ELASTIC 4X5.8 VLCR STR LF (GAUZE/BANDAGES/DRESSINGS) ×2 IMPLANT
BNDG ELASTIC 6X5.8 VLCR STR LF (GAUZE/BANDAGES/DRESSINGS) ×2 IMPLANT
BNDG GAUZE ELAST 4 BULKY (GAUZE/BANDAGES/DRESSINGS) ×2 IMPLANT
BOOTCOVER CLEANROOM LRG (PROTECTIVE WEAR) ×4 IMPLANT
COVER SURGICAL LIGHT HANDLE (MISCELLANEOUS) ×2 IMPLANT
COVER WAND RF STERILE (DRAPES) ×2 IMPLANT
CUFF TOURN SGL QUICK 34 (TOURNIQUET CUFF)
CUFF TRNQT CYL 34X4.125X (TOURNIQUET CUFF) IMPLANT
DRSG MEPITEL 4X7.2 (GAUZE/BANDAGES/DRESSINGS) ×1 IMPLANT
DRSG VAC ATS SM SENSATRAC (GAUZE/BANDAGES/DRESSINGS) ×1 IMPLANT
DURAPREP 26ML APPLICATOR (WOUND CARE) ×2 IMPLANT
ELECT REM PT RETURN 9FT ADLT (ELECTROSURGICAL)
ELECTRODE REM PT RTRN 9FT ADLT (ELECTROSURGICAL) IMPLANT
EVACUATOR 1/8 PVC DRAIN (DRAIN) IMPLANT
GAUZE SPONGE 4X4 12PLY STRL (GAUZE/BANDAGES/DRESSINGS) ×2 IMPLANT
GAUZE XEROFORM 1X8 LF (GAUZE/BANDAGES/DRESSINGS) ×2 IMPLANT
GLOVE BIOGEL PI ORTHO PRO SZ8 (GLOVE) ×1
GLOVE ORTHO TXT STRL SZ7.5 (GLOVE) ×2 IMPLANT
GLOVE PI ORTHO PRO STRL SZ8 (GLOVE) ×1 IMPLANT
GLOVE SURG ORTHO 8.0 STRL STRW (GLOVE) ×4 IMPLANT
GOWN STRL REUS W/ TWL LRG LVL3 (GOWN DISPOSABLE) IMPLANT
GOWN STRL REUS W/TWL LRG LVL3 (GOWN DISPOSABLE)
HANDPIECE INTERPULSE COAX TIP (DISPOSABLE)
KIT BASIN OR (CUSTOM PROCEDURE TRAY) ×2 IMPLANT
KIT TURNOVER KIT B (KITS) ×2 IMPLANT
MANIFOLD NEPTUNE II (INSTRUMENTS) ×2 IMPLANT
NS IRRIG 1000ML POUR BTL (IV SOLUTION) ×2 IMPLANT
PACK ORTHO EXTREMITY (CUSTOM PROCEDURE TRAY) ×2 IMPLANT
PAD ARMBOARD 7.5X6 YLW CONV (MISCELLANEOUS) ×4 IMPLANT
SET HNDPC FAN SPRY TIP SCT (DISPOSABLE) IMPLANT
SPONGE LAP 18X18 RF (DISPOSABLE) ×2 IMPLANT
STOCKINETTE IMPERVIOUS 9X36 MD (GAUZE/BANDAGES/DRESSINGS) ×2 IMPLANT
SUT ETHILON 3 0 PS 1 (SUTURE) IMPLANT
SWAB CULTURE ESWAB REG 1ML (MISCELLANEOUS) IMPLANT
TOWEL GREEN STERILE (TOWEL DISPOSABLE) ×2 IMPLANT
TOWEL GREEN STERILE FF (TOWEL DISPOSABLE) ×2 IMPLANT
TUBE CONNECTING 12X1/4 (SUCTIONS) ×2 IMPLANT
UNDERPAD 30X36 HEAVY ABSORB (UNDERPADS AND DIAPERS) ×2 IMPLANT
WATER STERILE IRR 1000ML POUR (IV SOLUTION) ×2 IMPLANT
YANKAUER SUCT BULB TIP NO VENT (SUCTIONS) ×2 IMPLANT

## 2020-05-19 NOTE — Progress Notes (Signed)
Used [employer] ID [phone] via [employer] in [place] to get patient ready for surgery.

## 2020-05-19 NOTE — Op Note (Signed)
05/19/2020  12:49 PM  PATIENT:  Nathaniel Parks    PRE-OPERATIVE DIAGNOSIS: Right knee septic prepatellar bursitis  POST-OPERATIVE DIAGNOSIS:  Same  PROCEDURE: Right knee excisional debridement, abscess, prepatellar bursa, with irrigation and debridement septic prepatellar bursitis with placement of wound VAC  SURGEON:  Eulas Post, MD  PHYSICIAN ASSISTANT: Janine Ores, PA-C, present and scrubbed throughout the case, critical for completion in a timely fashion, and for retraction, instrumentation, and closure.  ANESTHESIA:   General  PREOPERATIVE INDICATIONS:  Nathaniel Parks is a  74 y.o. male with a diagnosis of RIGHT KNEE INFECTION, SEPTIC BURSITIS who failed conservative measures and elected for surgical management.    The risks benefits and alternatives were discussed with the patient preoperatively including but not limited to the risks of infection, bleeding, nerve injury, cardiopulmonary complications, the need for revision surgery, among others, and the patient was willing to proceed.  ESTIMATED BLOOD LOSS: 75 mL  OPERATIVE IMPLANTS: Wound vac, small  OPERATIVE FINDINGS: Substantial purulence in the superolateral aspect, still within the retained bursa.  The infection did not appear to track into the joint, but did go right down to the patellar tendon.  OPERATIVE PROCEDURE: The patient was brought to the operating room and placed in the supine position.  He was on an antibiotic regimen already.  The right lower extremity was prepped and draped in usual sterile fashion using Betadine scrub and paint.  Timeout performed.  The incision that he had from the office irrigation and debridement was opened further proximally and distally, the prepatellar bursa was excised sharply using a scalpel, pickups, rondure, scissors.  The incision was approximately 12 cm long, and 2 cm deep, and went directly onto the anterior aspect of the patella, right to the level of the patellar tendon.  The  quadriceps tendon was intact.  I excised the bursa superiorly, inferiorly, medially, and laterally, and then irrigated the wounds with 9 L of saline.  A Mepitel dressing was applied, followed by a small wound VAC with excellent seal, a knee immobilizer, and the patient was awakened and returned to the PACU in stable and satisfactory condition.  There was 1 area over the anterolateral tibial crest that had some blanching, that I was concerned could represent a fluid collection separately, I used an 18-gauge needle to attempt an aspiration there but did not get any purulence.  He has a sending and descending cellulitis up and down the leg, which hopefully will resolve with the medical management of his infection.  If not he may require future MRI imaging of the lower extremity.  We will see how he responds clinically to the current surgical intervention.  He will need repeat irrigation and debridement with delayed primary closure later this week, likely by 1 my partners, all of which has been discussed with the patient using an interpreter.

## 2020-05-19 NOTE — Plan of Care (Signed)
  Problem: Education: Goal: Knowledge of General Education information will improve Description Including pain rating scale, medication(s)/side effects and non-pharmacologic comfort measures Outcome: Progressing   

## 2020-05-19 NOTE — Progress Notes (Signed)
Patient seen and examined.  Infected right prepatellar bursa, that is extending with cellulitis throughout the leg, failed incision, irrigation and debridement performed in the office, he has a very aggressive infection, and will need IV antibiotics, and surgical debridement with bursal excision, exploration, we will also assess whether or not it is going down into the joint.  Also plan for wound VAC placement, may need repeat surgical intervention, plan for surgical intervention today with incision, excisional debridement, irrigation, debridement, risks benefits and alternatives of discussed at length using interpretive services.  Plan to proceed accordingly.  Teryl Lucy, MD.

## 2020-05-19 NOTE — Transfer of Care (Signed)
Immediate Anesthesia Transfer of Care Note  Patient: Nathaniel Parks  Procedure(s) Performed: KNEE BURSECTOMY (Right Knee) IRRIGATION AND DEBRIDEMENT KNEE (Right Knee)  Patient Location: PACU  Anesthesia Type:General  Level of Consciousness: drowsy and patient cooperative  Airway & Oxygen Therapy: Patient Spontanous Breathing and Patient connected to nasal cannula oxygen  Post-op Assessment: Report given to RN, Post -op Vital signs reviewed and stable and Patient moving all extremities  Post vital signs: Reviewed and stable  Last Vitals:  Vitals Value Taken Time  BP    Temp    Pulse 126 05/19/20 1232  Resp 13 05/19/20 1232  SpO2 98 % 05/19/20 1232  Vitals shown include unvalidated device data.  Last Pain:  Vitals:   05/19/20 0730  TempSrc:   PainSc: 0-No pain      Patients Stated Pain Goal: 3 (05/19/20 1035)  Complications: No complications documented.

## 2020-05-19 NOTE — Anesthesia Preprocedure Evaluation (Signed)
Anesthesia Evaluation  Patient identified by MRN, date of birth, ID band Patient awake    Reviewed: Allergy & Precautions, H&P , NPO status , Patient's Chart, lab work & pertinent test results  Airway Mallampati: II   Neck ROM: full    Dental   Pulmonary neg pulmonary ROS,    breath sounds clear to auscultation       Cardiovascular negative cardio ROS   Rhythm:regular Rate:Normal     Neuro/Psych    GI/Hepatic   Endo/Other    Renal/GU      Musculoskeletal  (+) Arthritis ,   Abdominal   Peds  Hematology   Anesthesia Other Findings   Reproductive/Obstetrics                             Anesthesia Physical Anesthesia Plan  ASA: II  Anesthesia Plan: General   Post-op Pain Management:    Induction: Intravenous  PONV Risk Score and Plan: 2 and Ondansetron, Dexamethasone and Treatment may vary due to age or medical condition  Airway Management Planned: LMA  Additional Equipment:   Intra-op Plan:   Post-operative Plan:   Informed Consent: I have reviewed the patients History and Physical, chart, labs and discussed the procedure including the risks, benefits and alternatives for the proposed anesthesia with the patient or authorized representative who has indicated his/her understanding and acceptance.       Plan Discussed with: CRNA, Anesthesiologist and Surgeon  Anesthesia Plan Comments:         Anesthesia Quick Evaluation

## 2020-05-19 NOTE — Anesthesia Procedure Notes (Signed)
Procedure Name: LMA Insertion Date/Time: 05/19/2020 11:34 AM Performed by: Marena Chancy, CRNA Pre-anesthesia Checklist: Patient identified, Emergency Drugs available, Suction available and Patient being monitored Patient Re-evaluated:Patient Re-evaluated prior to induction Oxygen Delivery Method: Circle System Utilized Preoxygenation: Pre-oxygenation with 100% oxygen Induction Type: IV induction Ventilation: Mask ventilation without difficulty LMA: LMA inserted LMA Size: 4.0 Number of attempts: 1 Airway Equipment and Method: Bite block Placement Confirmation: positive ETCO2 Tube secured with: Tape Dental Injury: Teeth and Oropharynx as per pre-operative assessment

## 2020-05-19 NOTE — Progress Notes (Signed)
Pharmacy Antibiotic Note  Nathaniel Parks is a 74 y.o. male admitted on 05/18/2020 with septic bursitis .  Pharmacy has been consulted for Vancomycin dosing. Multiple po antibiotics prior to admission  Surgery planned for today - > bursectomy / I+D  Knee fluid culture from outside office - > MSSA  Plan: Continue Vancomycin 750 mg iv Q 12 hours Consider switch to cefazolin post surgery and for long term course  Height: 5' 4.96" (165 cm) Weight: 72.9 kg (160 lb 11.5 oz) IBW/kg (Calculated) : 61.41  Temp (24hrs), Avg:98.4 F (36.9 C), Min:97.8 F (36.6 C), Max:98.7 F (37.1 C)  Recent Labs  Lab 05/12/20 1702 05/18/20 1431  WBC 11.3* 8.7  CREATININE 0.70 1.27*    Estimated Creatinine Clearance: 44.3 mL/min (A) (by C-G formula based on SCr of 1.27 mg/dL (H)).    No Known Allergies   Thank you  Okey Regal, PharmD  05/19/2020 9:15 AM

## 2020-05-19 NOTE — Progress Notes (Signed)
Orthopedic Tech Progress Note Patient Details:  Nathaniel Parks 09-26-1946 [id number]  Ortho Devices Type of Ortho Device: Knee Immobilizer Ortho Device/Splint Location: Lower Right Extremity Ortho Device/Splint Interventions: Ordered, Adjustment   Post Interventions Patient Tolerated: Well Instructions Provided: Adjustment of device, Care of device, Poper ambulation with device  Fitted patient for knee immobilizer and left at bedside. [name] 05/19/2020, 7:10 PM

## 2020-05-19 NOTE — Progress Notes (Signed)
     [facility]    Date of Admission:  05/18/2020     Total days of antibiotics                Reason for Consult: Septic prepatellar Bursitis    Referring Provider: Dion Saucier Primary Care Provider: Patient, No Pcp Per   BRIEF CONSULT NOTE:  Nathaniel Parks is a 74 y/o male with right knee septic prepatellar bursitis s/p irrigation and excisional debridement with placement of wound VAC. Previous synovial fluid cultures obtained on 7/20 growing MSSA. Agree with current antimicrobial therapy with Cefazolin. Formal consult to follow tomorrow.   PLAN:  1. Continue Cefazolin.    Marcos Eke, NP [facility]  05/19/2020  3:28 PM

## 2020-05-19 NOTE — Progress Notes (Signed)
Pt [id number] Called on call PA Brown,Blaine and received new order to give 10 units of Novolog and check lab Hgb A1c.

## 2020-05-20 ENCOUNTER — Encounter (HOSPITAL_COMMUNITY): Payer: Self-pay | Admitting: Orthopedic Surgery

## 2020-05-20 DIAGNOSIS — B9561 Methicillin susceptible Staphylococcus aureus infection as the cause of diseases classified elsewhere: Secondary | ICD-10-CM

## 2020-05-20 DIAGNOSIS — R7303 Prediabetes: Secondary | ICD-10-CM

## 2020-05-20 DIAGNOSIS — M71161 Other infective bursitis, right knee: Principal | ICD-10-CM

## 2020-05-20 LAB — BASIC METABOLIC PANEL
Anion gap: 7 (ref 5–15)
BUN: 24 mg/dL — ABNORMAL HIGH (ref 8–23)
CO2: 27 mmol/L (ref 22–32)
Calcium: 8.4 mg/dL — ABNORMAL LOW (ref 8.9–10.3)
Chloride: 104 mmol/L (ref 98–111)
Creatinine, Ser: 0.94 mg/dL (ref 0.61–1.24)
GFR calc Af Amer: >60 mL/min (ref >60)
GFR calc non Af Amer: >60 mL/min (ref >60)
Glucose, Bld: 155 mg/dL — ABNORMAL HIGH (ref 70–99)
Potassium: 4.6 mmol/L (ref 3.5–5.1)
Sodium: 138 mmol/L (ref 135–145)

## 2020-05-20 LAB — CBC
HCT: 29.6 % — ABNORMAL LOW (ref 39.0–52.0)
Hemoglobin: 9.7 g/dL — ABNORMAL LOW (ref 13.0–17.0)
MCH: 30.8 pg (ref 26.0–34.0)
MCHC: 32.8 g/dL (ref 30.0–36.0)
MCV: 94 fL (ref 80.0–100.0)
Platelets: 299 10*3/uL (ref 150–400)
RBC: 3.15 MIL/uL — ABNORMAL LOW (ref 4.22–5.81)
RDW: 13.4 % (ref 11.5–15.5)
WBC: 7.4 10*3/uL (ref 4.0–10.5)
nRBC: 0 % (ref 0.0–0.2)

## 2020-05-20 LAB — HEPATITIS PANEL, ACUTE
HCV Ab: NONREACTIVE
Hep A IgM: NONREACTIVE
Hep B C IgM: NONREACTIVE
Hepatitis B Surface Ag: NONREACTIVE

## 2020-05-20 LAB — GLUCOSE, CAPILLARY
Glucose-Capillary: 152 mg/dL — ABNORMAL HIGH (ref 70–99)
Glucose-Capillary: 191 mg/dL — ABNORMAL HIGH (ref 70–99)
Glucose-Capillary: 139 mg/dL — ABNORMAL HIGH (ref 70–99)
Glucose-Capillary: 129 mg/dL — ABNORMAL HIGH (ref 70–99)

## 2020-05-20 LAB — HIV ANTIBODY (ROUTINE TESTING W REFLEX): HIV Screen 4th Generation wRfx: NONREACTIVE

## 2020-05-20 LAB — SEDIMENTATION RATE: Sed Rate: 71 mm/hr — ABNORMAL HIGH (ref 0–16)

## 2020-05-20 LAB — C-REACTIVE PROTEIN: CRP: 3.9 mg/dL — ABNORMAL HIGH (ref <1.0)

## 2020-05-20 MED ORDER — LIVING WELL WITH DIABETES BOOK - IN SPANISH
Freq: Once | Status: AC
  Filled 2020-05-20: qty 1

## 2020-05-20 MED ORDER — CEFAZOLIN SODIUM-DEXTROSE 2-4 GM/100ML-% IV SOLN
2.0000 g | Freq: Three times a day (TID) | INTRAVENOUS | Status: DC
  Administered 2020-05-20 – 2020-05-22 (×7): 2 g via INTRAVENOUS
  Filled 2020-05-20 (×10): qty 100

## 2020-05-20 MED ORDER — ACETAMINOPHEN 500 MG PO TABS
1000.0000 mg | ORAL_TABLET | Freq: Once | ORAL | Status: AC
  Administered 2020-05-21: 1000 mg via ORAL
  Filled 2020-05-20: qty 2

## 2020-05-20 NOTE — TOC Initial Note (Addendum)
Transition of Care [facility]) - Initial/Assessment Note    Patient Details  Name: Nathaniel Parks MRN: [id number] Date of Birth: September 13, 1946  [facility] CM/SW Contact:    Kingsley Plan, RN Phone Number: 05/20/2020, 3:14 PM  Clinical Narrative:                 Sherron Monday to patient and grand daughter at bedside via [facility] interpreter De Nurse [phone].  Patient from home with wife and son Christiane Ha and Jonathan's family. Confirmed face sheet information.   Patient's wife does not speak [place]. Christiane Ha and his family do speak [place] and [place]. Jonathan's phone number is [phone].   Discussed plan for patient to return to operating room tomorrow for repeat washout and prevena VAC. Explained prevena VAC to patient and grand daughter , they voiced understanding.   Also discussed IV antibiotics at home. Patient's infusion company will be [facility] [employer] . Pam will arrange a time to provide teaching to family and patient prior to discharge. Patient states Christiane Ha would be the contact to call to schedule teaching.       NCM will arrange home health RN , however, HHRN will not be present daily. Patient and family will be educated prior to discharge.   Patient does not have a PCP. NCM scheduled an appointment at [facility] for May 28, 2020. Information is on AVS.   Patient has a Orthoptist, however is not a citizen.    At discharge NCM will assist patient with prescriptions through [facility] and [facility].   [place] with [facility] reviewing referral to see if she can accept for [facility].   Patient and daughter aware of all of above.   Per PA patient to have a repeat washout tomorrow and discharge home on Friday.   Patient does not have any DME in the home.    Spoke to Hunter on phone and explained above. He voiced understanding.   [place] with [facility] unable to accept patient.  Tiffany with [facility] unable to accept patient.    Cassie with [facility] unable to accept patient .   Cory with [facility] may be able to accept with letter of guarantee for 5 visits , $160.00 per visit. Have called TOC Lead Steward Drone C and left message.  Expected Discharge Plan: Home w Home Health Services     Patient Goals and CMS Choice Patient states their goals for this hospitalization and ongoing recovery are:: to return to home CMS Medicare.gov Compare Post Acute Care list provided to:: Patient Choice offered to / list presented to : Patient  Expected Discharge Plan and Services Expected Discharge Plan: Home w Home Health Services   Discharge Planning Services: CM Consult Post Acute Care Choice: Home Health Living arrangements for the past 2 months: Single Family Home                   DME Agency: NA       HH Arranged: RN          Prior Living Arrangements/Services Living arrangements for the past 2 months: Single Family Home Lives with:: Adult Children Patient language and need for interpreter reviewed:: Yes Do you feel safe going back to the place where you live?: Yes            Criminal Activity/Legal Involvement Pertinent to Current Situation/Hospitalization: No - Comment as needed  Activities of Daily Living Home Assistive Devices/Equipment: None ADL Screening (condition at time  of admission) Patient's cognitive ability adequate to safely complete daily activities?: Yes Is the patient deaf or have difficulty hearing?: No Does the patient have difficulty seeing, even when wearing glasses/contacts?: No Does the patient have difficulty concentrating, remembering, or making decisions?: No Patient able to express need for assistance with ADLs?: Yes Does the patient have difficulty dressing or bathing?: No Independently performs ADLs?: Yes (appropriate for developmental age) Does the patient have difficulty walking or climbing stairs?: No Weakness of Legs: None Weakness of Arms/Hands: None  Permission  Sought/Granted   Permission granted to share information with : Yes, Verbal Permission Granted  Share Information with NAME: Reakwon Barren son [phone]           Emotional Assessment Appearance:: Appears stated age Attitude/Demeanor/Rapport: Engaged Affect (typically observed): Accepting Orientation: : Oriented to Self, Oriented to Place, Oriented to  Time, Oriented to Situation Alcohol / Substance Use: Not Applicable Psych Involvement: No (comment)  Admission diagnosis:  Septic prepatellar bursitis of right knee [M71.161] Patient Active Problem List   Diagnosis Date Noted  . Septic prepatellar bursitis of right knee 05/18/2020   PCP:  Patient, No Pcp Per Pharmacy:   [facility] [place], [place] - [address] [place] [address] Phone: [phone] Fax: [phone]  [id number] - [place], [place] - [address] AT [facility] [address] [place] [address] Phone: [phone] Fax: [phone]     Social Determinants of Health (SDOH) Interventions    Readmission Risk Interventions No flowsheet data found.

## 2020-05-20 NOTE — Anesthesia Postprocedure Evaluation (Signed)
Anesthesia Post Note  Patient: Nathaniel Parks  Procedure(s) Performed: KNEE BURSECTOMY (Right Knee) IRRIGATION AND DEBRIDEMENT KNEE (Right Knee)     Patient location during evaluation: PACU Anesthesia Type: General Level of consciousness: awake and alert Pain management: pain level controlled Vital Signs Assessment: post-procedure vital signs reviewed and stable Respiratory status: spontaneous breathing, nonlabored ventilation, respiratory function stable and patient connected to nasal cannula oxygen Cardiovascular status: blood pressure returned to baseline and stable Postop Assessment: no apparent nausea or vomiting Anesthetic complications: no   No complications documented.  Last Vitals:  Vitals:   05/20/20 0135 05/20/20 0521  BP: 112/66 113/68  Pulse: 59 57  Resp: 14 14  Temp: 36.9 C 36.5 C  SpO2: 98% 98%    Last Pain:  Vitals:   05/20/20 0522  TempSrc:   PainSc: 0-No pain                 Philomene Haff S

## 2020-05-20 NOTE — Anesthesia Preprocedure Evaluation (Addendum)
Anesthesia Evaluation  Patient identified by MRN, date of birth, ID band Patient awake    Reviewed: Allergy & Precautions, H&P , NPO status , Patient's Chart, lab work & pertinent test results  Airway Mallampati: II  TM Distance: >3 FB     Dental  (+) Dental Advisory Given   Pulmonary neg pulmonary ROS,    breath sounds clear to auscultation       Cardiovascular negative cardio ROS   Rhythm:Regular Rate:Normal     Neuro/Psych negative neurological ROS     GI/Hepatic negative GI ROS, Neg liver ROS,   Endo/Other  diabetes, Type 2, Oral Hypoglycemic Agents  Renal/GU negative Renal ROS     Musculoskeletal  (+) Arthritis ,   Abdominal   Peds  Hematology  (+) anemia ,   Anesthesia Other Findings   Reproductive/Obstetrics                            Lab Results  Component Value Date   WBC 7.4 05/20/2020   HGB 9.7 (L) 05/20/2020   HCT 29.6 (L) 05/20/2020   MCV 94.0 05/20/2020   PLT 299 05/20/2020   Lab Results  Component Value Date   CREATININE 0.94 05/20/2020   BUN 24 (H) 05/20/2020   NA 138 05/20/2020   K 4.6 05/20/2020   CL 104 05/20/2020   CO2 27 05/20/2020    Anesthesia Physical  Anesthesia Plan  ASA: II  Anesthesia Plan: General   Post-op Pain Management:    Induction: Intravenous  PONV Risk Score and Plan: 2 and Ondansetron, Dexamethasone and Treatment may vary due to age or medical condition  Airway Management Planned: LMA  Additional Equipment: None  Intra-op Plan:   Post-operative Plan: Extubation in OR  Informed Consent: I have reviewed the patients History and Physical, chart, labs and discussed the procedure including the risks, benefits and alternatives for the proposed anesthesia with the patient or authorized representative who has indicated his/her understanding and acceptance.     Dental advisory given  Plan Discussed with: CRNA  Anesthesia  Plan Comments:        Anesthesia Quick Evaluation

## 2020-05-20 NOTE — Progress Notes (Signed)
PHARMACY CONSULT NOTE FOR:  OUTPATIENT  PARENTERAL ANTIBIOTIC THERAPY (OPAT)  Indication: Septic bursitis Regimen: Cefazolin 2 gm IV Q 8 hrs End date: 06/18/20  IV antibiotic discharge orders are pended. To discharging provider:  please sign these orders via discharge navigator,  Select New Orders & click on the button choice - Manage This Unsigned Work.    Thank you for allowing pharmacy to be a part of this patient's care.  Vicki Mallet, PharmD, BCPS, [facility] Clinical Pharmacist 05/20/2020, 10:28 PM

## 2020-05-20 NOTE — Progress Notes (Addendum)
Subjective: 1 Day Post-Op s/p Procedure(s): KNEE BURSECTOMY IRRIGATION AND DEBRIDEMENT KNEE  Patient reports no pain this morning. Denies fever, chills, chest pain, shortness of breath, nausea, vomiting, and abdominal pain.    Objective:  PE: VITALS:   Vitals:   05/19/20 1805 05/19/20 2100 05/20/20 0135 05/20/20 0521  BP: (!) 116/63 105/72 112/66 113/68  Pulse: 95 80 59 57  Resp: 16 18 14 14   Temp: 99 F (37.2 C) 98.3 F (36.8 C) 98.5 F (36.9 C) 97.7 F (36.5 C)  TempSrc: Oral Oral Oral Oral  SpO2: 98% 99% 98% 98%  Weight:      Height:       General: Alert, oriented, in no acute distress Resp: No use of accessory musculature MSK: wound vac in place, seal intact with drainage in canister. Edema and erythema of lower leg looks mildly improved from yesterday. Continued blanching over tibial crest. EHL and FHL intact. Distal sensation intact. Compartments soft.   LABS  Results for orders placed or performed during the hospital encounter of 05/18/20 (from the past 24 hour(s))  Glucose, capillary     Status: Abnormal   Collection Time: 05/19/20 10:46 AM  Result Value Ref Range   Glucose-Capillary 169 (H) 70 - 99 mg/dL  Hemoglobin 05/21/20     Status: Abnormal   Collection Time: 05/19/20  2:31 PM  Result Value Ref Range   Hgb A1c MFr Bld 7.7 (H) 4.8 - 5.6 %   Mean Plasma Glucose 174.29 mg/dL  Glucose, capillary     Status: Abnormal   Collection Time: 05/19/20  5:56 PM  Result Value Ref Range   Glucose-Capillary 425 (H) 70 - 99 mg/dL  Glucose, capillary     Status: Abnormal   Collection Time: 05/19/20  9:01 PM  Result Value Ref Range   Glucose-Capillary 328 (H) 70 - 99 mg/dL  Glucose, capillary     Status: Abnormal   Collection Time: 05/19/20 10:54 PM  Result Value Ref Range   Glucose-Capillary 202 (H) 70 - 99 mg/dL  Basic metabolic panel     Status: Abnormal   Collection Time: 05/20/20  5:33 AM  Result Value Ref Range   Sodium 138 135 - 145 mmol/L   Potassium  4.6 3.5 - 5.1 mmol/L   Chloride 104 98 - 111 mmol/L   CO2 27 22 - 32 mmol/L   Glucose, Bld 155 (H) 70 - 99 mg/dL   BUN 24 (H) 8 - 23 mg/dL   Creatinine, Ser 05/22/20 0.61 - 1.24 mg/dL   Calcium 8.4 (L) 8.9 - 10.3 mg/dL   GFR calc non Af Amer >60 >60 mL/min   GFR calc Af Amer >60 >60 mL/min   Anion gap 7 5 - 15  CBC     Status: Abnormal   Collection Time: 05/20/20  5:33 AM  Result Value Ref Range   WBC 7.4 4.0 - 10.5 K/uL   RBC 3.15 (L) 4.22 - 5.81 MIL/uL   Hemoglobin 9.7 (L) 13.0 - 17.0 g/dL   HCT 05/22/20 (L) 39 - 52 %   MCV 94.0 80.0 - 100.0 fL   MCH 30.8 26.0 - 34.0 pg   MCHC 32.8 30.0 - 36.0 g/dL   RDW 04.5 40.9 - 81.1 %   Platelets 299 150 - 400 K/uL   nRBC 0.0 0.0 - 0.2 %  Glucose, capillary     Status: Abnormal   Collection Time: 05/20/20  7:51 AM  Result Value Ref Range   Glucose-Capillary  152 (H) 70 - 99 mg/dL    No results found.  Assessment/Plan: Principal Problem:   Septic prepatellar bursitis of right knee - Post Op day 1 KNEE BURSECTOMY IRRIGATION AND DEBRIDEMENT KNEE Weightbearing: WBAT RLE, up with therapy VTE prophylaxis: SCD's, mobilization Pain control: continue current regimen, patient states no pain this morning - will continue to keep an eye on blanched area near tibial crest, in OR yesterday this area was explored with an 18 gauge needle and attempted aspiration but nothing was able to be aspirated, if worsening will consider MRI, stable at this time - Plan for repeat irrigation and debridement with Dr. Everardo Pacific tomorrow morning with Provena wound vac placement - ID consulted regarding antibiotic regimen and need for PICC placement   Diabetes Mellitus Type II - continue home metformin - continue sliding scale insulin TID with meals - A1C 7.7   Contact information:   Weekdays 8-5 Janine Ores, PA-C [phone] A fter hours and holidays please check [email or web address] for group call information for [facility]  Armida Sans 05/20/2020, 8:43 AM

## 2020-05-20 NOTE — H&P (View-Only) (Signed)
Subjective: 1 Day Post-Op s/p Procedure(s): KNEE BURSECTOMY IRRIGATION AND DEBRIDEMENT KNEE  Patient reports no pain this morning. Denies fever, chills, chest pain, shortness of breath, nausea, vomiting, and abdominal pain.    Objective:  PE: VITALS:   Vitals:   05/19/20 1805 05/19/20 2100 05/20/20 0135 05/20/20 0521  BP: (!) 116/63 105/72 112/66 113/68  Pulse: 95 80 59 57  Resp: 16 18 14 14   Temp: 99 F (37.2 C) 98.3 F (36.8 C) 98.5 F (36.9 C) 97.7 F (36.5 C)  TempSrc: Oral Oral Oral Oral  SpO2: 98% 99% 98% 98%  Weight:      Height:       General: Alert, oriented, in no acute distress Resp: No use of accessory musculature MSK: wound vac in place, seal intact with drainage in canister. Edema and erythema of lower leg looks mildly improved from yesterday. Continued blanching over tibial crest. EHL and FHL intact. Distal sensation intact. Compartments soft.   LABS  Results for orders placed or performed during the hospital encounter of 05/18/20 (from the past 24 hour(s))  Glucose, capillary     Status: Abnormal   Collection Time: 05/19/20 10:46 AM  Result Value Ref Range   Glucose-Capillary 169 (H) 70 - 99 mg/dL  Hemoglobin 05/21/20     Status: Abnormal   Collection Time: 05/19/20  2:31 PM  Result Value Ref Range   Hgb A1c MFr Bld 7.7 (H) 4.8 - 5.6 %   Mean Plasma Glucose 174.29 mg/dL  Glucose, capillary     Status: Abnormal   Collection Time: 05/19/20  5:56 PM  Result Value Ref Range   Glucose-Capillary 425 (H) 70 - 99 mg/dL  Glucose, capillary     Status: Abnormal   Collection Time: 05/19/20  9:01 PM  Result Value Ref Range   Glucose-Capillary 328 (H) 70 - 99 mg/dL  Glucose, capillary     Status: Abnormal   Collection Time: 05/19/20 10:54 PM  Result Value Ref Range   Glucose-Capillary 202 (H) 70 - 99 mg/dL  Basic metabolic panel     Status: Abnormal   Collection Time: 05/20/20  5:33 AM  Result Value Ref Range   Sodium 138 135 - 145 mmol/L   Potassium  4.6 3.5 - 5.1 mmol/L   Chloride 104 98 - 111 mmol/L   CO2 27 22 - 32 mmol/L   Glucose, Bld 155 (H) 70 - 99 mg/dL   BUN 24 (H) 8 - 23 mg/dL   Creatinine, Ser 05/22/20 0.61 - 1.24 mg/dL   Calcium 8.4 (L) 8.9 - 10.3 mg/dL   GFR calc non Af Amer >60 >60 mL/min   GFR calc Af Amer >60 >60 mL/min   Anion gap 7 5 - 15  CBC     Status: Abnormal   Collection Time: 05/20/20  5:33 AM  Result Value Ref Range   WBC 7.4 4.0 - 10.5 K/uL   RBC 3.15 (L) 4.22 - 5.81 MIL/uL   Hemoglobin 9.7 (L) 13.0 - 17.0 g/dL   HCT 05/22/20 (L) 39 - 52 %   MCV 94.0 80.0 - 100.0 fL   MCH 30.8 26.0 - 34.0 pg   MCHC 32.8 30.0 - 36.0 g/dL   RDW 04.5 40.9 - 81.1 %   Platelets 299 150 - 400 K/uL   nRBC 0.0 0.0 - 0.2 %  Glucose, capillary     Status: Abnormal   Collection Time: 05/20/20  7:51 AM  Result Value Ref Range   Glucose-Capillary  152 (H) 70 - 99 mg/dL    No results found.  Assessment/Plan: Principal Problem:   Septic prepatellar bursitis of right knee - Post Op day 1 KNEE BURSECTOMY IRRIGATION AND DEBRIDEMENT KNEE Weightbearing: WBAT RLE, up with therapy VTE prophylaxis: SCD's, mobilization Pain control: continue current regimen, patient states no pain this morning - will continue to keep an eye on blanched area near tibial crest, in OR yesterday this area was explored with an 18 gauge needle and attempted aspiration but nothing was able to be aspirated, if worsening will consider MRI, stable at this time - Plan for repeat irrigation and debridement with Dr. Varkey tomorrow morning with Provena wound vac placement - ID consulted regarding antibiotic regimen and need for PICC placement   Diabetes Mellitus Type II - continue home metformin - continue sliding scale insulin TID with meals - A1C 7.7   Contact information:   Weekdays 8-5 Larence Thone, PA-C [phone] A fter hours and holidays please check [email or web address] for group call information for [facility]  Lachae Hohler K Lidiya Reise 05/20/2020, 8:43 AM 

## 2020-05-20 NOTE — Evaluation (Signed)
Physical Therapy Evaluation Patient Details Name: Nathaniel Parks MRN: [id number] DOB: 1946/09/14 Today's Date: 05/20/2020   History of Present Illness  74 yo male s/p  Right knee excisional debridement, abscess, prepatellar bursa, with irrigation and debridement septic prepatellar bursitis with placement of wound VAC on 7/27.  Clinical Impression   Pt presents with RLE post-operative weakness, mild antalgic gait improved with use of AD, and decreased activity tolerance vs baseline. Pt to benefit from acute PT to address deficits. Pt ambulated great hallway distance with little to no pain, does not require AD for balance but pt with increasing limp without AD so PT encouraged use of at least SL support during gait. Pt is mobilizing well, suspect no follow up needs from PT standpoint. May need OPPT once cleared by surgeon. PT to continue to follow acutely to progress mobility and practice stair navigation. PT to progress mobility as tolerated, and will continue to follow acutely.      Follow Up Recommendations Supervision for mobility/OOB;No PT follow up    Equipment Recommendations  None recommended by PT    Recommendations for Other Services       Precautions / Restrictions Precautions Precautions: Fall Required Braces or Orthoses: Knee Immobilizer - Right Knee Immobilizer - Right: On when out of bed or walking Restrictions Weight Bearing Restrictions: No RLE Weight Bearing: Weight bearing as tolerated      Mobility  Bed Mobility Overal bed mobility: Needs Assistance Bed Mobility: Supine to Sit     Supine to sit: Supervision     General bed mobility comments: for safety, lines/leads management.  Transfers Overall transfer level: Needs assistance Equipment used: Rolling walker (2 wheeled) Transfers: Sit to/from Stand Sit to Stand: Supervision         General transfer comment: for safety, safe hand placement when rising/sitting  Ambulation/Gait Ambulation/Gait  assistance: Supervision Gait Distance (Feet): 450 Feet Assistive device: Rolling walker (2 wheeled);IV Pole;None Gait Pattern/deviations: Step-through pattern;Antalgic;Decreased stance time - right Gait velocity: decr   General Gait Details: Supervision for safety, verbal cuing for upright posture and placement in RW when using it. Pt transitioned to SL support with IV pole, then to no support with continued supervision level of assist. Pt with more noticable antalgic gait without UE support, PT encouraging pt to use at least IV pole for support.  Stairs            Wheelchair Mobility    Modified Rankin (Stroke Patients Only)       Balance Overall balance assessment: Mild deficits observed, not formally tested                                           Pertinent Vitals/Pain Pain Assessment: 0-10 Pain Score: 0-No pain Pain Location: R knee Pain Intervention(s): Monitored during session;Limited activity within patient's tolerance    Home Living Family/patient expects to be discharged to:: Private residence Living Arrangements: Children (son Nathaniel Parks) Available Help at Discharge: Family;Available PRN/intermittently (son works during the day) Type of Home: House Home Access: Stairs to enter Entrance Stairs-Rails: Right Entrance Stairs-Number of Steps: 5 small Home Layout: One level Home Equipment: Walker - 2 wheels      Prior Function Level of Independence: Independent         Comments: pt reports PTA he was only walking short distances due to R knee pain     Hand Dominance  Dominant Hand: Right    Extremity/Trunk Assessment   Upper Extremity Assessment Upper Extremity Assessment: Overall WFL for tasks assessed    Lower Extremity Assessment Lower Extremity Assessment: Overall WFL for tasks assessed;RLE deficits/detail RLE Deficits / Details: Wound vac applied, knee flexion NT given KI orders and incision location. Able to perform SLR,  ankle pumps, and quad set RLE Sensation: WNL    Cervical / Trunk Assessment Cervical / Trunk Assessment: Normal  Communication   Communication: Prefers language other than English (spanish-speaking)  Cognition Arousal/Alertness: Awake/alert Behavior During Therapy: WFL for tasks assessed/performed Overall Cognitive Status: Within Functional Limits for tasks assessed                                        General Comments      Exercises General Exercises - Lower Extremity Ankle Circles/Pumps: AROM;Both;10 reps;Seated   Assessment/Plan    PT Assessment Patient needs continued PT services  PT Problem List Decreased activity tolerance;Decreased knowledge of use of DME;Decreased range of motion;Decreased mobility       PT Treatment Interventions DME instruction;Therapeutic activities;Gait training;Patient/family education;Balance training;Stair training;Therapeutic exercise;Functional mobility training    PT Goals (Current goals can be found in the Care Plan section)  Acute Rehab PT Goals PT Goal Formulation: With patient Time For Goal Achievement: 06/03/20 Potential to Achieve Goals: Good    Frequency Min 3X/week   Barriers to discharge        Co-evaluation               AM-PAC PT "6 Clicks" Mobility  Outcome Measure Help needed turning from your back to your side while in a flat bed without using bedrails?: None Help needed moving from lying on your back to sitting on the side of a flat bed without using bedrails?: None Help needed moving to and from a bed to a chair (including a wheelchair)?: None Help needed standing up from a chair using your arms (e.g., wheelchair or bedside chair)?: None Help needed to walk in hospital room?: A Little Help needed climbing 3-5 steps with a railing? : A Little 6 Click Score: 22    End of Session Equipment Utilized During Treatment: Gait belt;Right knee immobilizer Activity Tolerance: Patient tolerated  treatment well Patient left: in chair;with call bell/phone within reach;with chair alarm set Nurse Communication: Mobility status PT Visit Diagnosis: Other abnormalities of gait and mobility (R26.89);Difficulty in walking, not elsewhere classified (R26.2)    Time: 1425-1450 PT Time Calculation (min) (ACUTE ONLY): 25 min   Charges:   PT Evaluation $PT Eval Low Complexity: 1 Low PT Treatments $Gait Training: 8-22 mins        Tamarah Bhullar E, PT Acute Rehabilitation Services Pager [phone]  Office [phone]   Geary Rufo D Colleen Donahoe 05/20/2020, 5:05 PM

## 2020-05-20 NOTE — Progress Notes (Signed)
Inpatient Diabetes Program Recommendations  AACE/ADA: New Consensus Statement on Inpatient Glycemic Control (2015)  Target Ranges:  Prepandial:   less than 140 mg/dL      Peak postprandial:   less than 180 mg/dL (1-2 hours)      Critically ill patients:  140 - 180 mg/dL   Lab Results  Component Value Date   GLUCAP 191 (H) 05/20/2020   HGBA1C 7.7 (H) 05/19/2020    Review of Glycemic Control Results for Nathaniel Parks, Nathaniel Parks (MRN [id number]) as of 05/20/2020 14:10  Ref. Range 05/19/2020 17:56 05/19/2020 21:01 05/19/2020 22:54 05/20/2020 07:51 05/20/2020 11:41  Glucose-Capillary Latest Ref Range: 70 - 99 mg/dL 347 (H) 425 (H) 956 (H) 152 (H) 191 (H)   Diabetes history: DM2 (listed as prediabetes prior to admission) Outpatient Diabetes medications: None Current orders for Inpatient glycemic control: Metformin 500 mg bid + Novolog moderate correction tid  Inpatient Diabetes Program Recommendations:   Adm with Septic prepatellar bursitis of right knee. Noted hx prediabetes and now A1c 7.7 which indicates diabetes. Ordered Living Well With Diabetes & patient education q shift. Will plan to speak with patient regarding diabetes.  Thank you, Billy Fischer. Kamalani Mastro, RN, MSN, CDE  Diabetes Coordinator Inpatient Glycemic Control Team Team Pager [phone] (8am-5pm) 05/20/2020 2:18 PM

## 2020-05-20 NOTE — Consult Note (Signed)
Date of Admission:  05/18/2020          Reason for Consult: Septic prepatellar bursitis with Staph aureus    Referring Provider: Dr. Mardelle Matte   Assessment:  1. Septic prepatellar bursitis with methicillin sensitive Staph aureus 2. Prediabetic  Plan:  1. Continue cefazolin 2. Would place PICC line 3. Likely give him 4 weeks of parenteral antibiotics post surgery 4. Check baseline ESR CRP 5. Check HIV, hepatitis panel if not done  Principal Problem:   Septic prepatellar bursitis of right knee   Scheduled Meds: . aspirin  81 mg Oral BID  . docusate sodium  100 mg Oral BID  . insulin aspart  0-15 Units Subcutaneous TID WC  . metFORMIN  500 mg Oral BID WC  . mupirocin ointment  1 application Nasal BID  . senna  1 tablet Oral BID   Continuous Infusions: . 0.45 % NaCl with KCl 20 mEq / L 75 mL/hr at 05/19/20 1500  .  ceFAZolin (ANCEF) IV    . methocarbamol (ROBAXIN) IV     PRN Meds:.acetaminophen, bisacodyl, diphenhydrAMINE, HYDROcodone-acetaminophen, HYDROcodone-acetaminophen, magnesium citrate, methocarbamol **OR** methocarbamol (ROBAXIN) IV, metoCLOPramide **OR** metoCLOPramide (REGLAN) injection, morphine injection, ondansetron **OR** ondansetron (ZOFRAN) IV, polyethylene glycol  HPI: Nathaniel Parks is a 74 y.o. male no  Past medical history besides being prediabetic l history presented to the ED on 05/12/20 with 3 day history of right knee pain and swelling. Orthopedics was consulted, knee was aspirated and sent for gram stain, culture, sensitivity, and analysis.  Cultures ultimately yielded methicillin sensitive Staph aureus that was also sensitive to doxycycline.  He was discharged on doxycycline and followed up with Dr. Mardelle Matte He came into our office on 7/21 for follow-up where a bedside prepatellar bursectomy was performed. Patient was continued on doxycycline and bactrim was added. He followed up 7/23 showing improvement, however, followed up today in the office with  worsening symptoms. Patient statedhe has no pain at rest but increased 10/10 pain with any walking.  Yesterday was taken to the operating room and underwent excisional debridement of an abscess in the prepatellar bursa and placement of wound VAC.  Cultures were taken he was continued on cefazolin.  My understanding is he is going back to the operating room again tomorrow.  I would plan on giving him at least a 4-week course of cefazolin IV.  He sounds like he has the resources in terms of people at home who could help him in terms of administering antibiotics 3 times a day.  Interview conducted with telephonic Spanish translator. Review of Systems: Review of Systems  Constitutional: Positive for malaise/fatigue. Negative for chills, fever and weight loss.  HENT: Negative for congestion and sore throat.   Eyes: Negative for blurred vision and photophobia.  Respiratory: Negative for cough, shortness of breath and wheezing.   Cardiovascular: Negative for chest pain, palpitations and leg swelling.  Gastrointestinal: Negative for abdominal pain, blood in stool, constipation, diarrhea, heartburn, melena, nausea and vomiting.  Genitourinary: Negative for dysuria, flank pain and hematuria.  Musculoskeletal: Positive for joint pain and myalgias. Negative for back pain and falls.  Skin: Negative for itching and rash.  Neurological: Negative for dizziness, focal weakness, loss of consciousness, weakness and headaches.  Endo/Heme/Allergies: Does not bruise/bleed easily.  Psychiatric/Behavioral: Negative for depression and suicidal ideas. The patient does not have insomnia.     Past Medical History:  Diagnosis Date  . Pre-diabetes     Social History   Tobacco Use  .  Smoking status: Never Smoker  . Smokeless tobacco: Never Used  Vaping Use  . Vaping Use: Never used  Substance Use Topics  . Alcohol use: Not Currently  . Drug use: Never    History reviewed. No pertinent family history. No Known  Allergies  OBJECTIVE: Blood pressure 113/68, pulse 57, temperature 97.7 F (36.5 C), temperature source Oral, resp. rate 14, height 5' 4.75" (1.645 m), weight 72.9 kg, SpO2 98 %.  Physical Exam Constitutional:      General: He is not in acute distress.    Appearance: Normal appearance. He is well-developed. He is not ill-appearing or diaphoretic.  HENT:     Head: Normocephalic and atraumatic.     Right Ear: Hearing and external ear normal.     Left Ear: Hearing and external ear normal.     Nose: No nasal deformity or rhinorrhea.  Eyes:     General: No scleral icterus.    Extraocular Movements: Extraocular movements intact.     Conjunctiva/sclera: Conjunctivae normal.     Right eye: Right conjunctiva is not injected.     Left eye: Left conjunctiva is not injected.  Neck:     Vascular: No JVD.  Cardiovascular:     Rate and Rhythm: Normal rate and regular rhythm.     Heart sounds: S1 normal and S2 normal.  Pulmonary:     Effort: Pulmonary effort is normal. No respiratory distress.  Abdominal:     General: Bowel sounds are normal. There is no distension.     Palpations: Abdomen is soft.     Tenderness: There is no abdominal tenderness.  Musculoskeletal:     Right shoulder: Normal.     Left shoulder: Normal.     Cervical back: Normal range of motion and neck supple.     Right hip: Normal.     Left hip: Normal.     Right knee: Normal.     Left knee: Normal.  Lymphadenopathy:     Head:     Right side of head: No submandibular, preauricular or posterior auricular adenopathy.     Left side of head: No submandibular, preauricular or posterior auricular adenopathy.     Cervical: No cervical adenopathy.     Right cervical: No superficial or deep cervical adenopathy.    Left cervical: No superficial or deep cervical adenopathy.  Skin:    General: Skin is warm and dry.     Coloration: Skin is not pale.     Findings: No abrasion, bruising, ecchymosis, erythema, lesion or rash.      Nails: There is no clubbing.  Neurological:     General: No focal deficit present.     Mental Status: He is alert and oriented to person, place, and time.     Sensory: No sensory deficit.     Coordination: Coordination normal.     Gait: Gait normal.  Psychiatric:        Attention and Perception: He is attentive.        Mood and Affect: Mood normal.        Speech: Speech normal.        Behavior: Behavior normal. Behavior is cooperative.        Thought Content: Thought content normal.        Judgment: Judgment normal.    Right knee with wound vacuum and dressing in place   Lab Results Lab Results  Component Value Date   WBC 7.4 05/20/2020   HGB 9.7 (L) 05/20/2020  HCT 29.6 (L) 05/20/2020   MCV 94.0 05/20/2020   PLT 299 05/20/2020    Lab Results  Component Value Date   CREATININE 0.94 05/20/2020   BUN 24 (H) 05/20/2020   NA 138 05/20/2020   K 4.6 05/20/2020   CL 104 05/20/2020   CO2 27 05/20/2020   No results found for: ALT, AST, GGT, ALKPHOS, BILITOT   Microbiology: Recent Results (from the past 240 hour(s))  Body fluid culture     Status: None   Collection Time: 05/12/20  5:35 PM   Specimen: Synovium; Body Fluid  Result Value Ref Range Status   Specimen Description SYNOVIAL RIGHT KNEE  Final   Special Requests NONE  Final   Gram Stain   Final    ABUNDANT WBC PRESENT, PREDOMINANTLY PMN ABUNDANT GRAM POSITIVE COCCI CRITICAL RESULT CALLED TO, READ BACK BY AND VERIFIED WITH: T PHILIPS RN 05/12/20 1940 JDW Performed at [facility], [address] [place], [place] [address]    Culture ABUNDANT STAPHYLOCOCCUS AUREUS  Final   Report Status 05/14/2020 FINAL  Final   Organism ID, Bacteria STAPHYLOCOCCUS AUREUS  Final      Susceptibility   Staphylococcus aureus - MIC*    CIPROFLOXACIN <=0.5 SENSITIVE Sensitive     ERYTHROMYCIN >=8 RESISTANT Resistant     GENTAMICIN 8 INTERMEDIATE Intermediate     OXACILLIN 0.5 SENSITIVE Sensitive     TETRACYCLINE <=1 SENSITIVE  Sensitive     VANCOMYCIN <=0.5 SENSITIVE Sensitive     TRIMETH/SULFA <=10 SENSITIVE Sensitive     CLINDAMYCIN RESISTANT Resistant     RIFAMPIN <=0.5 SENSITIVE Sensitive     Inducible Clindamycin POSITIVE Resistant     * ABUNDANT STAPHYLOCOCCUS AUREUS  SARS Coronavirus 2 by RT PCR (hospital order, performed in [facility] lab) Nasopharyngeal Nasopharyngeal Swab     Status: None   Collection Time: 05/18/20  1:39 PM   Specimen: Nasopharyngeal Swab  Result Value Ref Range Status   SARS Coronavirus 2 NEGATIVE NEGATIVE Final    Comment: (NOTE) SARS-CoV-2 target nucleic acids are NOT DETECTED.  The SARS-CoV-2 RNA is generally detectable in upper and lower respiratory specimens during the acute phase of infection. The lowest concentration of SARS-CoV-2 viral copies this assay can detect is 250 copies / mL. A negative result does not preclude SARS-CoV-2 infection and should not be used as the sole basis for treatment or other patient management decisions.  A negative result may occur with improper specimen collection / handling, submission of specimen other than nasopharyngeal swab, presence of viral mutation(s) within the areas targeted by this assay, and inadequate number of viral copies (<250 copies / mL). A negative result must be combined with clinical observations, patient history, and epidemiological information.  Fact Sheet for Patients:   StrictlyIdeas.no  Fact Sheet for Healthcare Providers: BankingDealers.co.za  This test is not yet approved or  cleared by the [place] FDA and has been authorized for detection and/or diagnosis of SARS-CoV-2 by FDA under an Emergency Use Authorization (EUA).  This EUA will remain in effect (meaning this test can be used) for the duration of the COVID-19 declaration under Section 564(b)(1) of the Act, 21 U.S.C. section 360bbb-3(b)(1), unless the authorization is terminated or revoked  sooner.  Performed at [facility], [place] [address] [place], [place] [address]   Surgical PCR screen     Status: None   Collection Time: 05/18/20  1:43 PM   Specimen: Nasal Mucosa; Nasal Swab  Result Value  Ref Range Status   MRSA, PCR NEGATIVE NEGATIVE Final   Staphylococcus aureus NEGATIVE NEGATIVE Final    Comment: (NOTE) The Xpert SA Assay (FDA approved for NASAL specimens in patients 73 years of age and older), is one component of a comprehensive surveillance program. It is not intended to diagnose infection nor to guide or monitor treatment. Performed at [facility], [place] [address], [place], [place] [address]     Alcide Evener, [place] [facility] [phone] pager  05/20/2020, 11:28 AM

## 2020-05-21 ENCOUNTER — Encounter (HOSPITAL_COMMUNITY)
Admission: RE | Disposition: A | Discharge status: Home / Self Care | Payer: Self-pay | Source: Ambulatory Visit | Attending: Orthopedic Surgery

## 2020-05-21 ENCOUNTER — Inpatient Hospital Stay (HOSPITAL_COMMUNITY): Payer: Self-pay | Admitting: Anesthesiology

## 2020-05-21 ENCOUNTER — Inpatient Hospital Stay: Payer: Self-pay

## 2020-05-21 HISTORY — PX: I & D EXTREMITY: SHX5045

## 2020-05-21 HISTORY — PX: KNEE BURSECTOMY: SHX5882

## 2020-05-21 LAB — CBC
HCT: 28.7 % — ABNORMAL LOW (ref 39.0–52.0)
Hemoglobin: 9.4 g/dL — ABNORMAL LOW (ref 13.0–17.0)
MCH: 31.1 pg (ref 26.0–34.0)
MCHC: 32.8 g/dL (ref 30.0–36.0)
MCV: 95 fL (ref 80.0–100.0)
Platelets: 339 10*3/uL (ref 150–400)
RBC: 3.02 MIL/uL — ABNORMAL LOW (ref 4.22–5.81)
RDW: 13.7 % (ref 11.5–15.5)
WBC: 6 10*3/uL (ref 4.0–10.5)
nRBC: 0 % (ref 0.0–0.2)

## 2020-05-21 LAB — GLUCOSE, CAPILLARY
Glucose-Capillary: 133 mg/dL — ABNORMAL HIGH (ref 70–99)
Glucose-Capillary: 146 mg/dL — ABNORMAL HIGH (ref 70–99)
Glucose-Capillary: 206 mg/dL — ABNORMAL HIGH (ref 70–99)
Glucose-Capillary: 152 mg/dL — ABNORMAL HIGH (ref 70–99)
Glucose-Capillary: 132 mg/dL — ABNORMAL HIGH (ref 70–99)

## 2020-05-21 SURGERY — IRRIGATION AND DEBRIDEMENT EXTREMITY
Anesthesia: General | Site: Knee | Laterality: Right

## 2020-05-21 MED ORDER — BUPIVACAINE HCL 0.25 % IJ SOLN
INTRAMUSCULAR | Status: DC | PRN
  Administered 2020-05-21: 10 mL

## 2020-05-21 MED ORDER — EPHEDRINE SULFATE-NACL 50-0.9 MG/10ML-% IV SOSY
PREFILLED_SYRINGE | INTRAVENOUS | Status: DC | PRN
  Administered 2020-05-21: 15 mg via INTRAVENOUS

## 2020-05-21 MED ORDER — ONDANSETRON HCL 4 MG/2ML IJ SOLN: 4.0000 mg | Freq: Four times a day (QID) | INTRAMUSCULAR | Status: DC | PRN

## 2020-05-21 MED ORDER — PHENYLEPHRINE 40 MCG/ML (10ML) SYRINGE FOR IV PUSH (FOR BLOOD PRESSURE SUPPORT)
PREFILLED_SYRINGE | INTRAVENOUS | Status: DC | PRN
  Administered 2020-05-21: 160 ug via INTRAVENOUS

## 2020-05-21 MED ORDER — MIDAZOLAM HCL 5 MG/5ML IJ SOLN
INTRAMUSCULAR | Status: DC | PRN
  Administered 2020-05-21: 2 mg via INTRAVENOUS

## 2020-05-21 MED ORDER — TOBRAMYCIN SULFATE 1.2 G IJ SOLR
INTRAMUSCULAR | Status: DC | PRN
  Administered 2020-05-21: 1.2 g

## 2020-05-21 MED ORDER — LIDOCAINE 2% (20 MG/ML) 5 ML SYRINGE
INTRAMUSCULAR | Status: DC | PRN
  Administered 2020-05-21: 60 mg via INTRAVENOUS

## 2020-05-21 MED ORDER — CHLORHEXIDINE GLUCONATE CLOTH 2 % EX PADS
6.0000 | MEDICATED_PAD | Freq: Every day | CUTANEOUS | Status: DC
  Administered 2020-05-21 – 2020-05-22 (×2): 6 via TOPICAL

## 2020-05-21 MED ORDER — SODIUM CHLORIDE 0.9 % IR SOLN
Status: DC | PRN
  Administered 2020-05-21: 3000 mL

## 2020-05-21 MED ORDER — LACTATED RINGERS IV SOLN
INTRAVENOUS | Status: DC | PRN
Start: 2020-05-21 — End: 2020-05-21

## 2020-05-21 MED ORDER — FENTANYL CITRATE (PF) 250 MCG/5ML IJ SOLN
INTRAMUSCULAR | Status: AC
  Filled 2020-05-21: qty 5

## 2020-05-21 MED ORDER — DOCUSATE SODIUM 100 MG PO CAPS
100.0000 mg | ORAL_CAPSULE | Freq: Two times a day (BID) | ORAL | Status: DC
  Administered 2020-05-21 – 2020-05-22 (×3): 100 mg via ORAL
  Filled 2020-05-21 (×3): qty 1

## 2020-05-21 MED ORDER — SODIUM CHLORIDE 0.9% FLUSH
10.0000 mL | INTRAVENOUS | Status: DC | PRN
  Administered 2020-05-21: 10 mL

## 2020-05-21 MED ORDER — PROPOFOL 10 MG/ML IV BOLUS
INTRAVENOUS | Status: DC | PRN
  Administered 2020-05-21: 150 mg via INTRAVENOUS

## 2020-05-21 MED ORDER — FENTANYL CITRATE (PF) 100 MCG/2ML IJ SOLN: 25.0000 ug | INTRAMUSCULAR | Status: DC | PRN

## 2020-05-21 MED ORDER — VANCOMYCIN HCL 1000 MG IV SOLR
INTRAVENOUS | Status: AC
  Filled 2020-05-21: qty 1000

## 2020-05-21 MED ORDER — ONDANSETRON HCL 4 MG PO TABS: 4.0000 mg | ORAL_TABLET | Freq: Four times a day (QID) | ORAL | Status: DC | PRN

## 2020-05-21 MED ORDER — FENTANYL CITRATE (PF) 100 MCG/2ML IJ SOLN
INTRAMUSCULAR | Status: DC | PRN
  Administered 2020-05-21 (×2): 100 ug via INTRAVENOUS
  Administered 2020-05-21: 50 ug via INTRAVENOUS

## 2020-05-21 MED ORDER — AMISULPRIDE (ANTIEMETIC) 5 MG/2ML IV SOLN: 10.0000 mg | Freq: Once | INTRAVENOUS | Status: DC | PRN

## 2020-05-21 MED ORDER — ONDANSETRON HCL 4 MG/2ML IJ SOLN
INTRAMUSCULAR | Status: AC
  Filled 2020-05-21: qty 2

## 2020-05-21 MED ORDER — ONDANSETRON HCL 4 MG/2ML IJ SOLN
INTRAMUSCULAR | Status: DC | PRN
  Administered 2020-05-21: 4 mg via INTRAVENOUS

## 2020-05-21 MED ORDER — VANCOMYCIN HCL 1000 MG IV SOLR
INTRAVENOUS | Status: DC | PRN
  Administered 2020-05-21: 1000 mg

## 2020-05-21 MED ORDER — GENTAMICIN SULFATE 40 MG/ML IJ SOLN
INTRAMUSCULAR | Status: AC
  Filled 2020-05-21: qty 2

## 2020-05-21 MED ORDER — MIDAZOLAM HCL 2 MG/2ML IJ SOLN
INTRAMUSCULAR | Status: AC
  Filled 2020-05-21: qty 2

## 2020-05-21 MED ORDER — TOBRAMYCIN SULFATE 1.2 G IJ SOLR
INTRAMUSCULAR | Status: AC
  Filled 2020-05-21: qty 1.2

## 2020-05-21 MED ORDER — BUPIVACAINE HCL (PF) 0.25 % IJ SOLN
INTRAMUSCULAR | Status: AC
  Filled 2020-05-21: qty 30

## 2020-05-21 MED ORDER — PROPOFOL 10 MG/ML IV BOLUS
INTRAVENOUS | Status: AC
  Filled 2020-05-21: qty 20

## 2020-05-21 MED ORDER — LIDOCAINE 2% (20 MG/ML) 5 ML SYRINGE
INTRAMUSCULAR | Status: AC
  Filled 2020-05-21: qty 5

## 2020-05-21 SURGICAL SUPPLY — 52 items
BNDG CMPR MED 10X6 ELC LF (GAUZE/BANDAGES/DRESSINGS) ×2
BNDG ELASTIC 4X5.8 VLCR STR LF (GAUZE/BANDAGES/DRESSINGS) ×3 IMPLANT
BNDG ELASTIC 6X10 VLCR STRL LF (GAUZE/BANDAGES/DRESSINGS) ×1 IMPLANT
BNDG ELASTIC 6X5.8 VLCR STR LF (GAUZE/BANDAGES/DRESSINGS) ×3 IMPLANT
BNDG GAUZE ELAST 4 BULKY (GAUZE/BANDAGES/DRESSINGS) ×3 IMPLANT
CANISTER WOUND CARE 500ML ATS (WOUND CARE) ×1 IMPLANT
CNTNR URN SCR LID CUP LEK RST (MISCELLANEOUS) IMPLANT
CONT SPEC 4OZ STRL OR WHT (MISCELLANEOUS)
COVER SURGICAL LIGHT HANDLE (MISCELLANEOUS) ×3 IMPLANT
COVER WAND RF STERILE (DRAPES) ×3 IMPLANT
CUFF TOURN SGL LL 12 NO SLV (MISCELLANEOUS) IMPLANT
CUFF TOURN SGL QUICK 34 (TOURNIQUET CUFF)
CUFF TRNQT CYL 34X4.125X (TOURNIQUET CUFF) IMPLANT
DRAPE U-SHAPE 47X51 STRL (DRAPES) IMPLANT
DRESSING PREVENA PLUS CUSTOM (GAUZE/BANDAGES/DRESSINGS) IMPLANT
DRSG PAD ABDOMINAL 8X10 ST (GAUZE/BANDAGES/DRESSINGS) ×3 IMPLANT
DRSG PREVENA PLUS CUSTOM (GAUZE/BANDAGES/DRESSINGS) ×3
DURAPREP 26ML APPLICATOR (WOUND CARE) ×3 IMPLANT
ELECT REM PT RETURN 9FT ADLT (ELECTROSURGICAL)
ELECTRODE REM PT RTRN 9FT ADLT (ELECTROSURGICAL) IMPLANT
GAUZE SPONGE 4X4 12PLY STRL (GAUZE/BANDAGES/DRESSINGS) ×3 IMPLANT
GAUZE XEROFORM 1X8 LF (GAUZE/BANDAGES/DRESSINGS) ×3 IMPLANT
GLOVE BIO SURGEON STRL SZ 6.5 (GLOVE) ×3 IMPLANT
GLOVE BIOGEL PI IND STRL 8 (GLOVE) ×2 IMPLANT
GLOVE BIOGEL PI INDICATOR 8 (GLOVE) ×1
GLOVE ECLIPSE 8.0 STRL XLNG CF (GLOVE) ×6 IMPLANT
GLOVE INDICATOR 6.5 STRL GRN (GLOVE) ×3 IMPLANT
GOWN STRL REUS W/ TWL LRG LVL3 (GOWN DISPOSABLE) ×2 IMPLANT
GOWN STRL REUS W/ TWL XL LVL3 (GOWN DISPOSABLE) ×4 IMPLANT
GOWN STRL REUS W/TWL LRG LVL3 (GOWN DISPOSABLE) ×3
GOWN STRL REUS W/TWL XL LVL3 (GOWN DISPOSABLE) ×6
HANDPIECE INTERPULSE COAX TIP (DISPOSABLE)
KIT BASIN OR (CUSTOM PROCEDURE TRAY) ×3 IMPLANT
KIT PREVENA INCISION MGT 13 (CANNISTER) ×1 IMPLANT
KIT TURNOVER KIT B (KITS) ×3 IMPLANT
MANIFOLD NEPTUNE II (INSTRUMENTS) ×3 IMPLANT
NS IRRIG 1000ML POUR BTL (IV SOLUTION) ×3 IMPLANT
PACK ORTHO EXTREMITY (CUSTOM PROCEDURE TRAY) ×3 IMPLANT
PAD ARMBOARD 7.5X6 YLW CONV (MISCELLANEOUS) ×6 IMPLANT
PAD CAST 4YDX4 CTTN HI CHSV (CAST SUPPLIES) ×2 IMPLANT
PADDING CAST COTTON 4X4 STRL (CAST SUPPLIES) ×3
PADDING CAST COTTON 6X4 STRL (CAST SUPPLIES) ×3 IMPLANT
SET HNDPC FAN SPRY TIP SCT (DISPOSABLE) IMPLANT
SPONGE LAP 18X18 RF (DISPOSABLE) IMPLANT
SUT ETHILON 2 0 FS 18 (SUTURE) ×3 IMPLANT
SUT ETHILON 2 LR (SUTURE) ×3 IMPLANT
SWAB CULTURE ESWAB REG 1ML (MISCELLANEOUS) IMPLANT
TOWEL GREEN STERILE (TOWEL DISPOSABLE) ×3 IMPLANT
TOWEL GREEN STERILE FF (TOWEL DISPOSABLE) ×3 IMPLANT
TUBE CONNECTING 12X1/4 (SUCTIONS) ×3 IMPLANT
UNDERPAD 30X36 HEAVY ABSORB (UNDERPADS AND DIAPERS) ×3 IMPLANT
YANKAUER SUCT BULB TIP NO VENT (SUCTIONS) ×3 IMPLANT

## 2020-05-21 NOTE — Interval H&P Note (Signed)
All questions answered. I was asked to participate in this case as my partner is unavailable. Plan for a washout and strip vac versus a additionalWash out at a later date.

## 2020-05-21 NOTE — Progress Notes (Signed)
Peripherally Inserted Central Catheter Placement  The IV Nurse has discussed with the patient and/or persons authorized to consent for the patient, the purpose of this procedure and the potential benefits and risks involved with this procedure.  The benefits include less needle sticks, lab draws from the catheter, and the patient may be discharged home with the catheter. Risks include, but not limited to, infection, bleeding, blood clot (thrombus formation), and puncture of an artery; nerve damage and irregular heartbeat and possibility to perform a PICC exchange if needed/ordered by physician.  Alternatives to this procedure were also discussed.  Bard Power PICC patient education guide, fact sheet on infection prevention and patient information card has been provided to patient /or left at bedside.    PICC Placement Documentation  PICC Single Lumen 05/21/20 Right Basilic 34 cm 2 cm (Active)  Indication for Insertion or Continuance of Line Home intravenous therapies (PICC only) 05/21/20 1121  Exposed Catheter (cm) 2 cm 05/21/20 1121  Site Assessment Clean;Dry;Intact 05/21/20 1121  Line Status Blood return noted;Flushed;Saline locked 05/21/20 1121  Dressing Type Transparent;Securing device 05/21/20 1121  Dressing Status Clean;Dry;Intact;Antimicrobial disc in place 05/21/20 1121  Dressing Change Due 05/28/20 05/21/20 1121       Romie Jumper 05/21/2020, 11:25 AM

## 2020-05-21 NOTE — Anesthesia Procedure Notes (Signed)
Procedure Name: LMA Insertion Date/Time: 05/21/2020 7:17 AM Performed by: Shireen Quan, CRNA Pre-anesthesia Checklist: Patient identified, Emergency Drugs available, Suction available and Patient being monitored Patient Re-evaluated:Patient Re-evaluated prior to induction Oxygen Delivery Method: Circle System Utilized Preoxygenation: Pre-oxygenation with 100% oxygen Induction Type: IV induction Ventilation: Mask ventilation without difficulty LMA: LMA inserted LMA Size: 4.0 Number of attempts: 1 Placement Confirmation: positive ETCO2 Tube secured with: Tape Dental Injury: Teeth and Oropharynx as per pre-operative assessment

## 2020-05-21 NOTE — Progress Notes (Signed)
[facility]  Date of Admission:  05/18/2020     Total days of antibiotics 4         ASSESSMENT:  Nathaniel Parks had repeat debridement earlier today with wound vac placement for septic prepatellar bursitis with surgical cultures positive for MSSA. PICC line inserted today.  Continue current dose of Cefazolin. Will need 4 weeks of antibiotic therapy from source control/last surgery date with end date planned for 06/18/20. Follow up arranged in ID clinic. Continue wound care per orthopedics.   PLAN:  1. Continue current dose of Cefazolin. 2. OPAT / Home Health orders placed. 3. Wound care per orthopedics.  4. Follow up in ID clinic.  Diagnosis:   Septic pre-patellar bursitis   Culture Result: MSSA  No Known Allergies  OPAT Orders Discharge antibiotics to be given via PICC line Discharge antibiotics: Cefazolin  Per pharmacy protocol   Duration: 4 weeks   End Date: 06/18/20  [facility] Care Per Protocol:  Home health RN for IV administration and teaching; PICC line care and labs.    Labs weekly while on IV antibiotics: _X_ CBC with differential _X_ CMP _X_ CRP _X_ ESR   _X_ Please pull PIC at completion of IV antibiotics __ Please leave PIC in place until doctor has seen patient or been notified  Fax weekly labs to [phone]  Clinic Follow Up Appt:  11:15 am on 8/19 with Nathaniel Piedra NP   Principal Problem:   Septic prepatellar bursitis of right knee   . aspirin  81 mg Oral BID  . docusate sodium  100 mg Oral BID  . insulin aspart  0-15 Units Subcutaneous TID WC  . metFORMIN  500 mg Oral BID WC  . senna  1 tablet Oral BID    SUBJECTIVE:  Afebrile overnight with no acute events. Repeat debridement today. Groggy following surgery. Son present.   No Known Allergies   Review of Systems: Review of Systems  Constitutional: Negative for chills, fever and weight loss.  Respiratory: Negative for cough, shortness of breath and  wheezing.   Cardiovascular: Negative for chest pain and leg swelling.  Gastrointestinal: Negative for abdominal pain, constipation, diarrhea, nausea and vomiting.  Musculoskeletal:       Positive for right knee pain  Skin: Negative for rash.      OBJECTIVE: Vitals:   05/21/20 0821 05/21/20 0830 05/21/20 0831 05/21/20 0902  BP: (!) 134/67  (!) 133/68 126/67  Pulse: 65 62 65 63  Resp: 15 (!) 10 15 17   Temp:   97.7 F (36.5 C) 98.6 F (37 C)  TempSrc:    Oral  SpO2: 100% 96% 95% 97%  Weight:      Height:       Body mass index is 26.95 kg/m.  Physical Exam Constitutional:      General: He is not in acute distress.    Appearance: He is well-developed.     Comments: Resting quietly in bed; pleasant.   Cardiovascular:     Rate and Rhythm: Normal rate and regular rhythm.     Heart sounds: Normal heart sounds.  Pulmonary:     Effort: Pulmonary effort is normal.     Breath sounds: Normal breath sounds.  Musculoskeletal:     Comments: Wound VAC patent.   Skin:    General: Skin is warm and dry.  Neurological:     Mental Status: He is alert.     Lab Results Lab Results  Component Value  Date   WBC 6.0 05/21/2020   HGB 9.4 (L) 05/21/2020   HCT 28.7 (L) 05/21/2020   MCV 95.0 05/21/2020   PLT 339 05/21/2020    Lab Results  Component Value Date   CREATININE 0.94 05/20/2020   BUN 24 (H) 05/20/2020   NA 138 05/20/2020   K 4.6 05/20/2020   CL 104 05/20/2020   CO2 27 05/20/2020   No results found for: ALT, AST, GGT, ALKPHOS, BILITOT   Microbiology: Recent Results (from the past 240 hour(s))  Body fluid culture     Status: None   Collection Time: 05/12/20  5:35 PM   Specimen: Synovium; Body Fluid  Result Value Ref Range Status   Specimen Description SYNOVIAL RIGHT KNEE  Final   Special Requests NONE  Final   Gram Stain   Final    ABUNDANT WBC PRESENT, PREDOMINANTLY PMN ABUNDANT GRAM POSITIVE COCCI CRITICAL RESULT CALLED TO, READ BACK BY AND VERIFIED WITH: T  PHILIPS RN 05/12/20 1940 JDW Performed at [facility], [address] [place], [place] [address]    Culture ABUNDANT STAPHYLOCOCCUS AUREUS  Final   Report Status 05/14/2020 FINAL  Final   Organism ID, Bacteria STAPHYLOCOCCUS AUREUS  Final      Susceptibility   Staphylococcus aureus - MIC*    CIPROFLOXACIN <=0.5 SENSITIVE Sensitive     ERYTHROMYCIN >=8 RESISTANT Resistant     GENTAMICIN 8 INTERMEDIATE Intermediate     OXACILLIN 0.5 SENSITIVE Sensitive     TETRACYCLINE <=1 SENSITIVE Sensitive     VANCOMYCIN <=0.5 SENSITIVE Sensitive     TRIMETH/SULFA <=10 SENSITIVE Sensitive     CLINDAMYCIN RESISTANT Resistant     RIFAMPIN <=0.5 SENSITIVE Sensitive     Inducible Clindamycin POSITIVE Resistant     * ABUNDANT STAPHYLOCOCCUS AUREUS  SARS Coronavirus 2 by RT PCR (hospital order, performed in [place] hospital lab) Nasopharyngeal Nasopharyngeal Swab     Status: None   Collection Time: 05/18/20  1:39 PM   Specimen: Nasopharyngeal Swab  Result Value Ref Range Status   SARS Coronavirus 2 NEGATIVE NEGATIVE Final    Comment: (NOTE) SARS-CoV-2 target nucleic acids are NOT DETECTED.  The SARS-CoV-2 RNA is generally detectable in upper and lower respiratory specimens during the acute phase of infection. The lowest concentration of SARS-CoV-2 viral copies this assay can detect is 250 copies / mL. A negative result does not preclude SARS-CoV-2 infection and should not be used as the sole basis for treatment or other patient management decisions.  A negative result may occur with improper specimen collection / handling, submission of specimen other than nasopharyngeal swab, presence of viral mutation(s) within the areas targeted by this assay, and inadequate number of viral copies (<250 copies / mL). A negative result must be combined with clinical observations, patient history, and epidemiological information.  Fact Sheet for Patients:    StrictlyIdeas.no  Fact Sheet for Healthcare Providers: BankingDealers.co.za  This test is not yet approved or  cleared by the [place] FDA and has been authorized for detection and/or diagnosis of SARS-CoV-2 by FDA under an Emergency Use Authorization (EUA).  This EUA will remain in effect (meaning this test can be used) for the duration of the COVID-19 declaration under Section 564(b)(1) of the Act, 21 U.S.C. section 360bbb-3(b)(1), unless the authorization is terminated or revoked sooner.  Performed at [facility], [place] [address] [place], [place] [address]   Surgical PCR screen     Status: None   Collection Time:  05/18/20  1:43 PM   Specimen: Nasal Mucosa; Nasal Swab  Result Value Ref Range Status   MRSA, PCR NEGATIVE NEGATIVE Final   Staphylococcus aureus NEGATIVE NEGATIVE Final    Comment: (NOTE) The Xpert SA Assay (FDA approved for NASAL specimens in patients 72 years of age and older), is one component of a comprehensive surveillance program. It is not intended to diagnose infection nor to guide or monitor treatment. Performed at [facility], [place] [address] [place], [place] [address]      Nathaniel Parks, [facility]  05/21/2020  11:22 AM

## 2020-05-21 NOTE — Transfer of Care (Signed)
Immediate Anesthesia Transfer of Care Note  Patient: Nathaniel Parks  Procedure(s) Performed: IRRIGATION AND DEBRIDEMENT EXTREMITY (Right ) KNEE BURSECTOMY (Right Knee)  Patient Location: PACU  Anesthesia Type:General  Level of Consciousness: drowsy and patient cooperative  Airway & Oxygen Therapy: Patient Spontanous Breathing and Patient connected to nasal cannula oxygen  Post-op Assessment: Report given to RN and Post -op Vital signs reviewed and stable  Post vital signs: Reviewed and stable  Last Vitals:  Vitals Value Taken Time  BP 134/54 05/21/20 0806  Temp    Pulse 58 05/21/20 0808  Resp 11 05/21/20 0808  SpO2 93 % 05/21/20 0808  Vitals shown include unvalidated device data.  Last Pain:  Vitals:   05/21/20 0542  TempSrc: Oral  PainSc:       Patients Stated Pain Goal: 3 (05/19/20 1441)  Complications: No complications documented.

## 2020-05-21 NOTE — Op Note (Signed)
Orthopaedic Surgery Operative Note (CSN: [id number])  Nathaniel Parks  1946-02-09 Date of Surgery: 05/21/2020   Diagnoses:  Septic prepatellar bursitis  Procedure: Right prepatellar bursectomy and wound closure   Operative Finding Successful completion of the planned procedure.  Patient's wound was relatively healthy-appearing and the resultant cellulitis had improved.  We felt that closure with a incisional VAC would be appropriate.  Patient will be discharged once antibiotic plan is settled by infectious disease  Post-operative plan: The patient will be readmitted to the floor on IV antibiotics.  The patient will be weightbearing to tolerance with the knee in a knee immobilizer and Prevena wound VAC.  DVT prophylaxis per primary team, no orthopedic contraindications.   Pain control with PRN pain medication preferring oral medicines.  Follow up plan will be scheduled in approximately 7 days for incision check and XR.  Post-Op Diagnosis: Same Surgeons:Primary: Bjorn Pippin, MD Assistants:Caroline McBane PA-C Location: [facility] OR ROOM 07 Anesthesia: General Antibiotics: Baseline antibiotics and 1 g of vancomycin powder and 1.2 g of tobramycin powder locally Tourniquet time: * No tourniquets in log * Estimated Blood Loss: Minimal Complications: None Specimens: None Implants: * No implants in log *  Indications for Surgery:   Nathaniel Parks is a 74 y.o. male with septic prepatellar bursitis management with my partners.  Due to the patient's surgical availability I was asked to help with the second portion of the patient's care.  Benefits and risks of operative and nonoperative management were discussed prior to surgery with patient/guardian(s) and informed consent form was completed.  Specific risks including infection, need for additional surgery, continued infection, need for further washout, lengthy course of antibiotics amongst others   Procedure:   The patient was identified properly. Informed  consent was obtained and the surgical site was marked. The patient was taken up to suite where general anesthesia was induced.  The patient was positioned supine on a regular bed.  The right leg was prepped and draped in the usual sterile fashion.  Timeout was performed before the beginning of the case.  We removed the previous wound VAC and noted that there was no obvious purulent tissue.  Excisional debridement of tendon, subcutaneous fat and skin was performed.  There is essentially no purulent appearing material and overall the patient's wound appeared to be relatively healthy-appearing with good granulation tissue.  The remaining bursa was all removed.  We then irrigated with 6 L normal saline and performed a closure with nonabsorbable suture.  A Prevena wound VAC was placed.  Patient was awoken taken to PACU in stable condition.  Alfonse Alpers, PA-C, present and scrubbed throughout the case, critical for completion in a timely fashion, and for retraction, instrumentation, closure.

## 2020-05-22 ENCOUNTER — Encounter (HOSPITAL_COMMUNITY): Payer: Self-pay | Admitting: Orthopaedic Surgery

## 2020-05-22 LAB — CBC
HCT: 29.2 % — ABNORMAL LOW (ref 39.0–52.0)
Hemoglobin: 9.4 g/dL — ABNORMAL LOW (ref 13.0–17.0)
MCH: 30.7 pg (ref 26.0–34.0)
MCHC: 32.2 g/dL (ref 30.0–36.0)
MCV: 95.4 fL (ref 80.0–100.0)
Platelets: 336 10*3/uL (ref 150–400)
RBC: 3.06 MIL/uL — ABNORMAL LOW (ref 4.22–5.81)
RDW: 13.7 % (ref 11.5–15.5)
WBC: 4.9 10*3/uL (ref 4.0–10.5)
nRBC: 0 % (ref 0.0–0.2)

## 2020-05-22 LAB — BASIC METABOLIC PANEL
Anion gap: 9 (ref 5–15)
BUN: 15 mg/dL (ref 8–23)
CO2: 25 mmol/L (ref 22–32)
Calcium: 8.2 mg/dL — ABNORMAL LOW (ref 8.9–10.3)
Chloride: 103 mmol/L (ref 98–111)
Creatinine, Ser: 0.95 mg/dL (ref 0.61–1.24)
GFR calc Af Amer: >60 mL/min (ref >60)
GFR calc non Af Amer: >60 mL/min (ref >60)
Glucose, Bld: 272 mg/dL — ABNORMAL HIGH (ref 70–99)
Potassium: 4 mmol/L (ref 3.5–5.1)
Sodium: 137 mmol/L (ref 135–145)

## 2020-05-22 LAB — GLUCOSE, CAPILLARY
Glucose-Capillary: 114 mg/dL — ABNORMAL HIGH (ref 70–99)
Glucose-Capillary: 189 mg/dL — ABNORMAL HIGH (ref 70–99)
Glucose-Capillary: 72 mg/dL (ref 70–99)

## 2020-05-22 MED ORDER — CEFAZOLIN IV (FOR PTA / DISCHARGE USE ONLY): 2.0000 g | Freq: Three times a day (TID) | INTRAVENOUS | 0 refills | Status: AC

## 2020-05-22 MED ORDER — METFORMIN HCL 500 MG PO TABS: 500.0000 mg | ORAL_TABLET | Freq: Two times a day (BID) | ORAL | 11 refills | Status: DC

## 2020-05-22 NOTE — Discharge Summary (Addendum)
Discharge Summary  Patient ID: Nathaniel Parks MRN: [id number] DOB/AGE: [birth year] 74 y.o.  Admit date: 05/18/2020 Discharge date: 05/22/2020  Admission Diagnoses:  Septic prepatellar bursitis of right knee  Discharge Diagnoses:  Principal Problem:   Septic prepatellar bursitis of right knee   Past Medical History:  Diagnosis Date  . Pre-diabetes     Surgeries: Procedure(s): IRRIGATION AND DEBRIDEMENT EXTREMITY KNEE BURSECTOMY on 05/19/2020  IRRIGATION AND DEBRIDEMENT EXTREMITY KNEE BURSECTOMY on 05/21/2020   Consultants (if any):   Discharged Condition: Improved  Hospital Course: Nathaniel Parks is an 74 y.o. male who was admitted 05/18/2020 with a diagnosis of Septic prepatellar bursitis of right knee and went to the operating room on 05/19/20 and 05/21/2020 and underwent the above named procedures.    He was given perioperative antibiotics:  Anti-infectives (From admission, onward)   Start     Dose/Rate Route Frequency Ordered Stop   05/22/20 0000  ceFAZolin (ANCEF) IVPB     Discontinue     2 g Intravenous Every 8 hours 05/22/20 0831 06/20/20 2359   05/21/20 0741  tobramycin (NEBCIN) powder  Status:  Discontinued          As needed 05/21/20 0741 05/21/20 0803   05/21/20 0740  vancomycin (VANCOCIN) powder  Status:  Discontinued          As needed 05/21/20 0740 05/21/20 0803   05/20/20 1400  ceFAZolin (ANCEF) IVPB 1 g/50 mL premix  Status:  Discontinued        1 g 100 mL/hr over 30 Minutes Intravenous Every 8 hours 05/19/20 1357 05/20/20 0821   05/20/20 1400  ceFAZolin (ANCEF) IVPB 2g/100 mL premix     Discontinue     2 g 200 mL/hr over 30 Minutes Intravenous Every 8 hours 05/20/20 0821     05/19/20 1800  ceFAZolin (ANCEF) IVPB 2g/100 mL premix        2 g 200 mL/hr over 30 Minutes Intravenous Every 6 hours 05/19/20 1339 05/20/20 0530   05/19/20 0600  ceFAZolin (ANCEF) IVPB 2g/100 mL premix        2 g 200 mL/hr over 30 Minutes Intravenous To Short Stay 05/18/20 1326 05/19/20 1209    05/19/20 0000  vancomycin (VANCOREADY) IVPB 750 mg/150 mL  Status:  Discontinued        750 mg 150 mL/hr over 60 Minutes Intravenous Every 12 hours 05/18/20 1425 05/19/20 1339   05/18/20 1430  vancomycin (VANCOCIN) IVPB 1000 mg/200 mL premix        1,000 mg 200 mL/hr over 60 Minutes Intravenous  Once 05/18/20 1425 05/18/20 1627    .  He was given sequential compression devices and early ambulation for DVT prophylaxis.  He benefited maximally from the hospital stay and there were no complications.    Recent vital signs:  Vitals:   05/22/20 0455 05/22/20 1319  BP: 123/74 107/72  Pulse: 66 90  Resp: 18 16  Temp: 98.1 F (36.7 C) 99.2 F (37.3 C)  SpO2: 97% 96%    Recent laboratory studies:  Lab Results  Component Value Date   HGB 9.4 (L) 05/22/2020   HGB 9.4 (L) 05/21/2020   HGB 9.7 (L) 05/20/2020   Lab Results  Component Value Date   WBC 4.9 05/22/2020   PLT 336 05/22/2020   No results found for: INR Lab Results  Component Value Date   NA 137 05/22/2020   K 4.0 05/22/2020   CL 103 05/22/2020   CO2 25 05/22/2020   BUN 15  05/22/2020   CREATININE 0.95 05/22/2020   GLUCOSE 272 (H) 05/22/2020    Discharge Medications:   Allergies as of 05/22/2020   No Known Allergies     Medication List    STOP taking these medications   amoxicillin 500 MG capsule Commonly known as: AMOXIL   Bactrim DS 800-160 MG tablet Generic drug: sulfamethoxazole-trimethoprim   doxycycline 100 MG capsule Commonly known as: VIBRAMYCIN     TAKE these medications   ceFAZolin  IVPB Commonly known as: ANCEF Inject 2 g into the vein every 8 (eight) hours. Indication:  Septic bursitis First Dose: No Last Day of Therapy:  06/18/20 Labs - Once weekly:  CBC/D and BMP, Labs - Every other week:  ESR and CRP Method of administration: IV Push Method of administration may be changed at the discretion of home infusion pharmacist based upon assessment of the patient and/or caregiver's ability  to self-administer the medication ordered.   metFORMIN 500 MG tablet Commonly known as: GLUCOPHAGE Take 1 tablet (500 mg total) by mouth 2 (two) times daily with a meal. What changed: Another medication with the same name was added. Make sure you understand how and when to take each.   metFORMIN 500 MG tablet Commonly known as: Glucophage Take 1 tablet (500 mg total) by mouth 2 (two) times daily with a meal. What changed: You were already taking a medication with the same name, and this prescription was added. Make sure you understand how and when to take each.   traMADol 50 MG tablet Commonly known as: ULTRAM Take 50 mg by mouth 3 (three) times daily as needed for pain.            Discharge Care Instructions  (From admission, onward)         Start     Ordered   05/22/20 0000  Change dressing on IV access line weekly and PRN  (Home infusion instructions - [facility] )        05/22/20 0831          Diagnostic Studies: DG Knee Complete 4 Views Right  Result Date: 05/12/2020 CLINICAL DATA:  Swelling without trauma EXAM: RIGHT KNEE - COMPLETE 4+ VIEW COMPARISON:  None. FINDINGS: No acute fracture or dislocation. There are mild tricompartmental degenerative changes with joint space narrowing and mild osteophyte formation. Multiple well corticated ossific densities at the posterior joint space likely reflecting fabellae versus sequela of remote prior trauma. No area of erosion or osseous destruction. No unexpected radiopaque foreign body. Soft tissue edema overlying the patella and suprapatellar area without large joint effusion. No subcutaneous air. IMPRESSION: 1. Soft tissue edema overlying the patella and suprapatellar area without large joint effusion. 2. No acute fracture or dislocation. 3. No radiographic evidence of osteomyelitis. Electronically Signed   By: Valentino Saxon MD   On: 05/12/2020 12:16   [place] EKG SITE RITE  Result Date: 05/21/2020 If Site Rite image  not attached, placement could not be confirmed due to current cardiac rhythm.   Disposition: Discharge disposition: 01-Home or Self Care       Discharge Instructions    [facility] pharmacist to adjust dose for Vancomycin, Aminoglycosides and other anti-infective therapies as requested by physician.   Complete by: As directed    [facility] to provide Cath Flo 22m   Complete by: As directed    Administer for PICC line occlusion and as ordered by physician for other access device issues.   Anaphylaxis Kit: Provided  to treat any anaphylactic reaction to the medication being provided to the patient if First Dose or when requested by physician   Complete by: As directed    Epinephrine 89m/ml vial / amp: Administer 0.371m(0.35m635msubcutaneously once for moderate to severe anaphylaxis, nurse to call physician and pharmacy when reaction occurs and call 911 if needed for immediate care   Diphenhydramine 79m84m IV vial: Administer 25-79mg735mIM PRN for first dose reaction, rash, itching, mild reaction, nurse to call physician and pharmacy when reaction occurs   Sodium Chloride 0.9% NS 500ml 47mAdminister if needed for hypovolemic blood pressure drop or as ordered by physician after call to physician with anaphylactic reaction   Change dressing on IV access line weekly and PRN   Complete by: As directed    Flush IV access with Sodium Chloride 0.9% and Heparin 10 units/ml or 100 units/ml   Complete by: As directed    Home infusion instructions - [facility]   Complete by: As directed    Instructions: Flush IV access with Sodium Chloride 0.9% and Heparin 10units/ml or 100units/ml   Change dressing on IV access line: Weekly and PRN   Instructions Cath Flo 2mg: A28mnister for PICC Line occlusion and as ordered by physician for other access device   [facility] pharmacist to adjust dose for: Vancomycin, Aminoglycosides and other anti-infective therapies as  requested by physician   Method of administration may be changed at the discretion of home infusion pharmacist based upon assessment of the patient and/or caregiver's ability to self-administer the medication ordered   Complete by: As directed        Follow-up Information    [facility] up.   Specialty: Internal Medicine Why: May 28, 2020 at 0915 am  Contact information: [phone]        [place] up.   Specialties: Family Medicine, Infectious Diseases Why: 11:15am on 8/19. Please call if you are unable to make this appointment.  Contact information: [phone]        Landau,Marchia Bondchedule an appointment as soon as possible for a visit in 1 week(s).   Specialty: Orthopedic Surgery Contact information: [phone]         Signed: Keshauna Degraffenreid Jola Baptist021, 2:41 PM

## 2020-05-22 NOTE — Discharge Instructions (Signed)
Diet: As you were doing prior to hospitalization   Shower:  May shower but keep the wounds dry, use an occlusive plastic wrap, NO SOAKING IN TUB.  If the bandage gets wet, change with a clean dry gauze.  If you have a splint on, leave the splint in place and keep the splint dry with a plastic bag.  Dressing:  You may change your dressing 3-5 days after surgery, unless you have a splint.  If you have a splint, then just leave the splint in place and we will change your bandages during your first follow-up appointment.    If you had hand or foot surgery, we will plan to remove your stitches in about 2 weeks in the office.  For all other surgeries, there are sticky tapes (steri-strips) on your wounds and all the stitches are absorbable.  Leave the steri-strips in place when changing your dressings, they will peel off with time, usually 2-3 weeks.  Activity:  Increase activity slowly as tolerated, but follow the weight bearing instructions below.  The rules on driving is that you can not be taking narcotics while you drive, and you must feel in control of the vehicle.    Weight Bearing:   Weight bearing as tolerated  To prevent constipation: you may use a stool softener such as -  Colace (over the counter) 100 mg by mouth twice a day  Drink plenty of fluids (prune juice may be helpful) and high fiber foods Miralax (over the counter) for constipation as needed.    Itching:  If you experience itching with your medications, try taking only a single pain pill, or even half a pain pill at a time.  You may take up to 10 pain pills per day, and you can also use benadryl over the counter for itching or also to help with sleep.   Precautions:  If you experience chest pain or shortness of breath - call 911 immediately for transfer to the hospital emergency department!!  If you develop a fever greater that 101 F, purulent drainage from wound, increased redness or drainage from wound, or calf pain -- Call the  office at [phone]                                                Follow- Up Appointment:  Please call for an appointment to be seen in 2 weeks [place] - [phone]    Contar carbohidratos y la diabetes  Por qu es importante el conteo de carbohidratos?  [employer] las porciones de carbohidratos ayuda a Sales executive nivel de glucosa (azcar) en su sangre para que se sienta mejor.  . El equilibrio entre los carbohidratos que come y Dietitian determina el nivel de glucosa que tendr en la sangre despus de comer.  [employer] carbohidratos tambin le ayudar a planificar sus comidas.   Qu alimentos contienen carbohidratos?  Entre los alimentos con carbohidratos se incluyen:  . Panes, galletas saladas y cereales  . Pastas, arroz y granos  . Vegetales (verduras) con almidn, como papas, elote (maz o choclo) y chcharos (guisantes o arvejas)  . Frijoles (habichuelas) y legumbres  . Leche, leche de soya y Dentist  . Nils Pyle y jugos de fruta  . Dulces como pasteles, galletas, helados, mermeladas y jaleas   Porciones de carbohidratos  Al planificar comidas para la diabetes,  recuerde que un alimento con 1 porcin de carbohidratos contiene aproximadamente 15 gramos de carbohidratos:  . Revise el tamao de las porciones con tazas y cucharas de medir o con una pesa de alimentos.  Clint Lipps. Lea los Datos de Nutricin en las etiquetas de los alimentos para saber cuntos gramos de carbohidratos contienen los alimentos que come.   Los Eaton Corporationalimentos en las listas de este folleto muestran porciones que contienen cerca de 15 gramos de carbohidratos. Copyright Academy of Nutrition and Dietetics. This handout may be duplicated for client education. Carbohydrate Counting for Diabetes (Spanish) - 2   Consejos para planificar sus comidas  . Un Plan de Alimentacin indica cuntas porciones de carbohidratos consumir en sus comidas y refrigerios (snacks). Para muchos adultos es adecuado comer 3 a 5 porciones de  carbohidratos en cada comida y de 1 a 2 porciones de carbohidratos, en cada refrigerio.  . En un Plan de Alimentacin diaria saludable, la mayora de los carbohidratos provienen de:  o Al menos 6 porciones de frutas y vegetales sin almidn  o Al menos 6 porciones de Forensic scientistgranos, frijoles y Sports administratorvegetales con almidn, con al menos 3 de estas porciones de granos integrales (enteros)  o Al menos 2 porciones de Teaching laboratory technicianleche o productos lcteos  . Revise regularmente su nivel de glucosa en la sangre. Esto puede indicarle si necesita ajustar las horas a las que consume carbohidratos.  . Comer alimentos que contienen Forest Ranchfibra, como granos [place] Springsintegrales, y comer muy pocos alimentos salados es bueno para su salud.  . Coma 4 a 6 onzas de carne u otros alimentos con protenas (como hamburguesas de soya) cada da. Elija fuentes de protena bajas en grasa, como carne de res y de cerdo bajas en grasa, pollo, pescado, queso bajo en grasa o alimentos vegetarianos como la soya.  . Coma algunas grasas saludables, como aceite de [place]oliva, de canola y nueces.  . Coma muy pocas grasas saturadas. Estas grasas no son saludables y se Merchandiser, retailencuentran en la mantequilla, la crema y las carnes con mucha grasa, como el tocino (tocineta) y las salchichas o Advertising copywriterchorizos.  . Coma muy pocas o nada de grasas trans. Estas grasas no son saludables y se encuentran en todos los alimentos que contienen aceites "parcialmente hidrogenados" en su lista de ingredientes.   Consejos para leer etiquetas  En los Datos de Nutricin de las etiquetas aparece una lista con el total de gramos de carbohidratos en una porcin estndar. La porcin estndar puede ser mayor o menor que 1 porcin de carbohidratos. Para saber cuntas porciones de carbohidratos hay en un alimento:  . Primero mire el tamao de la porcin estndar de la etiqueta.  Tia Alert. Luego verifique el total de gramos de carbohidratos. Esta es la cantidad de carbohidratos en 1 porcin estndar. Divida el total de gramos de  carbohidratos por 15. Este nmero equivale al nmero de porciones de carbohidratos en 1 porcin estndar. Recuerde: 1 porcin de carbohidratos equivale a 15 gramos de carbohidratos.  . Nota: Puede ignorar los gramos de Morgan Stanleyazcar en los Datos de Nutricin, ya que estn incluidos en el total de gramos de carbohidratos.  Copyright Academy of Nutrition and Dietetics. This handout may be duplicated for client education. Carbohydrate Counting for Diabetes (Spanish) - 3   Listas de alimentos para el conteo de carbohidratos  1 porcin = cerca de 15 gramos de carbohidratos  Almidones  . 1 rebanada de pan (1 onza)  . 1 tortilla (6 pulgadas)  .  rosca de pan (bagel) grande (  1 onza)  . 2 tortillas para taco (5 pulgadas)  .  pan para hamburguesa o para salchicha (hot dog) (3/4 onza)  .  taza de cereal listo para comer sin endulzar  .  taza de cereal cocido  . 1 taza de sopa a base de caldo  . 4-6 galletitas saladas  . ? taza de pasta o arroz (cocidos)  .  taza de frijoles, chcharos, granos de elote, camotes (batatas, boniatos), calabaza (zapallo), pur de papas o papas hervidas (cocidos)  .  papa grande asada (3 onzas)  .  onza de pretzels, papitas o totopos (tortilla chips)  . 3 tazas de palomitas de maz Best boy) (ya preparadas)   Nils Pyle  . 1 fruta fresca pequea ( a 1 taza)  .  taza de fruta enlatada o congelada  . 17 uvas pequeas (3 onzas)  . 1 taza de meln, bayas (moras)  .  vaso de jugo de fruta  . 2 cucharadas de frutas secas (arndanos azules/blueberries, cerezas, arndanos rojos/cranberries, frutas surtidas, uvas pasas/pasitas)  Leche  . 1 taza de [employer] o reducida en grasa  . 1 taza de leche de soya  . ? taza de yogur descremado endulzado con un edulcorante sin azcar (6 onzas)   Dulces y postres  . pastel cuadrado de 2 pulgadas (sin betn/cobertura)  . 2 galletitas dulces (? onzas)  .  taza de helado o yogur congelado  .  taza de sorbete (sherbet) o nieve  (sorbet)  . 1 cucharada de jarabe (sirope), mermelada, jalea, azcar o miel  . 2 cucharadas de jarabe bajo en caloras  Copyright Academy of Nutrition and Dietetics. This handout may be duplicated for client education. Carbohydrate Counting for Diabetes (Spanish) - 4   Otros alimentos  . Cuente 1 taza de vegetales crudos o  taza de vegetales sin almidn, cocidas, como porciones de alimentos con cero (0) carbohidratos o "sin restriccin." Si come 3 o ms porciones en una comida, cuntelas como 1 porcin de carbohidratos.  . Los alimentos que contienen menos de 20 caloras en cada porcin tambin pueden contarse como porciones con cero carbohidratos o alimentos "sin restriccin."  . Cuente 1 taza de guiso (estofado) u otros alimentos combinados como 2 porciones de carbohidratos.   Notas: Copyright Academy of Nutrition and Dietetics. This handout may be duplicated for client education. Carbohydrate Counting for Diabetes (Spanish) - 5   Contar carbohidratos y la diabetes: Ejemplo de men para 1 da Desayuno  1 pltano/banana pequeo (1 carbohidrato)   taza de hojuelas de maz (cornflakes) (1 carbohidrato)  1 taza de leche descremada o baja en grasa (1 carbohidrato)  1 rebanada de pan de trigo integral (1 carbohidrato)  1 cucharadita de margarina   Almuerzo  2 onzas de rebanadas de [place]  2 rebanadas de pan de trigo integral (2 carbohidratos)  2 hojas de Company secretary  4 palitos de apio  4 palitos de zanahoria  1 Environmental health practitioner (1 carbohidrato)  1 taza de leche descremada o baja en grasa (1 carbohidrato)   Refrigerio  2 cucharadas de uvas pasas/pasitas (1 carbohidrato)   onzas de mini pretzels sin sal (1 carbohidrato)   Cena  3 onzas de carne asada de res, magra   papa grande asada (2 carbohidratos)  1 cucharada de crema agria reducida en grasa   taza de ejotes/habichuelas verdes/chauchas  1 taza de ensalada de vegetales  1 cucharada de aderezo para ensaladas reducido en caloras  1  panecillo de trigo integral (1  carbohidrato)  1 cucharadita de margarina  1 taza de bolitas de meln (1 carbohidrato)   Refrigerio  6 onzas de yogur de frutas bajo en grasa, sin azcar (1 carbohidrato)  2 cucharadas de nueces sin sal

## 2020-05-22 NOTE — Progress Notes (Signed)
Brief Nutrition Education Note  RD working remotely.  RD consulted for nutrition education regarding diabetes.   Lab Results  Component Value Date   HGBA1C 7.7 (H) 05/19/2020   PTA DM medications are none.   Labs reviewed: CBGS: 114-152 (inpatient orders for glycemic control are 0-15 units insulin aspart TID with meals and 500 mg metformin BID).   Attempted to speak with pt via phone x 2, however, no answer.   Diabetes coordinator has seen pt. Plan for discharge today. Pt is uninsured and will follow-up at the [facility].   RD provided "Carbohydrate Counting for People with Diabetes" (in pt's preferred language of Spanish) handout from the Academy of Nutrition and Dietetics; attached to AVS/ discharge summary.   Current diet order is carb modifed, patient is consuming approximately 100% of meals at this time. Labs and medications reviewed. No further nutrition interventions warranted at this time. RD contact information provided. If additional nutrition issues arise, please re-consult RD.  Levada Schilling, RD, LDN, CDCES Registered Dietitian II Certified Diabetes Care and Education Specialist Please refer to [facility] for RD and/or RD on-call/weekend/after hours pager

## 2020-05-22 NOTE — Progress Notes (Addendum)
Subjective: 1 Day Post-Op s/p Procedure(s): IRRIGATION AND DEBRIDEMENT EXTREMITY KNEE BURSECTOMY  Patient reports no pain this morning. Denies fever, chills, chest pain, SOB, Calf pain, nausea/vomiting. No other complaints.    Objective:  PE: VITALS:   Vitals:   05/21/20 0902 05/21/20 1302 05/21/20 2012 05/22/20 0455  BP: 126/67 (!) 122/57 (!) 133/56 123/74  Pulse: 63 70 70 66  Resp: 17   18  Temp: 98.6 F (37 C) 98.2 F (36.8 C) 97.9 F (36.6 C) 98.1 F (36.7 C)  TempSrc: Oral Oral Oral Oral  SpO2: 97% 99% 98% 97%  Weight:      Height:       General: Alert, oriented, in no acute distress Resp: No use of accessory musculature GI: soft, non-tender MSK: wound vac in place, seal intact with drainage in canister. Edema and erythema of lower leg looks to be improving. No fluctuance noted. EHL and FHL intact. Distal sensation intact. Compartments soft.   LABS  Results for orders placed or performed during the hospital encounter of 05/18/20 (from the past 24 hour(s))  Glucose, capillary     Status: Abnormal   Collection Time: 05/21/20  8:28 AM  Result Value Ref Range   Glucose-Capillary 133 (H) 70 - 99 mg/dL   Comment 1 Notify RN    Comment 2 Document in Chart   Glucose, capillary     Status: Abnormal   Collection Time: 05/21/20  9:01 AM  Result Value Ref Range   Glucose-Capillary 146 (H) 70 - 99 mg/dL  Glucose, capillary     Status: Abnormal   Collection Time: 05/21/20 11:45 AM  Result Value Ref Range   Glucose-Capillary 206 (H) 70 - 99 mg/dL  Glucose, capillary     Status: Abnormal   Collection Time: 05/21/20  4:05 PM  Result Value Ref Range   Glucose-Capillary 152 (H) 70 - 99 mg/dL  Glucose, capillary     Status: Abnormal   Collection Time: 05/21/20  9:07 PM  Result Value Ref Range   Glucose-Capillary 132 (H) 70 - 99 mg/dL  Glucose, capillary     Status: Abnormal   Collection Time: 05/22/20  8:05 AM  Result Value Ref Range   Glucose-Capillary 114 (H)  70 - 99 mg/dL    [place] EKG SITE RITE  Result Date: 05/21/2020 If Site Rite image not attached, placement could not be confirmed due to current cardiac rhythm.   Assessment/Plan: Principal Problem:   Septic prepatellar bursitis of right knee - first I&D and bursectomy on 7/27, 2nd performed 7/29 - doing well Weightbearing: WBAT RLE, up with therapy VTE prophylaxis: SCD's, mobilization Pain control: continue current regimen, patient states no pain this morning - PICC line has been placed, ID recommending 4 weeks of cefazolin with follow up in their office - Plan to follow up with Dr. Dion Saucier in 1 week - continue knee immobilizer when walking, okay to discontinue at rest - Provena wound vac to be attached at discharge - repeat CBC and CMP ordered today  Diabetes Type II - order placed to diabetes coordinator, PCP set up for asap follow-up regarding diabetes. A1C 7.7, managed on sliding scale insulin while inpatient. Patient states that he has been managing diabetes with healthy diet and decreased carb intact. Counseled patient to resume metformin at discharge.   - Plan to discharge this afternoon  Contact information:   Weekdays 8-5 Janine Ores, [place] [phone] A fter hours and holidays please check [email or web address] for group call information for  [facility]  Armida Sans 05/22/2020, 8:24 AM

## 2020-05-22 NOTE — Progress Notes (Signed)
Nathaniel Parks to be D/C'd  per MD order. Discussed with the patient and all questions fully answered.  VSS, Skin clean, dry and intact without evidence of skin break down, no evidence of skin tears noted.  IV catheter discontinued intact. Site without signs and symptoms of complications. Dressing and pressure applied.  An After Visit Summary was printed and given to the patient. Patient received prescription.  D/c education completed with patient/family including follow up instructions, medication list, d/c activities limitations if indicated, with other d/c instructions as indicated by MD - patient able to verbalize understanding, all questions fully answered.   Patient instructed to return to ED, call 911, or call MD for any changes in condition.   Patient to be escorted via WC, and D/C home via private auto.

## 2020-05-22 NOTE — Anesthesia Postprocedure Evaluation (Signed)
Anesthesia Post Note  Patient: Nathaniel Parks  Procedure(s) Performed: IRRIGATION AND DEBRIDEMENT EXTREMITY (Right ) KNEE BURSECTOMY (Right Knee)     Patient location during evaluation: PACU Anesthesia Type: General Level of consciousness: awake and alert Pain management: pain level controlled Vital Signs Assessment: post-procedure vital signs reviewed and stable Respiratory status: spontaneous breathing, nonlabored ventilation, respiratory function stable and patient connected to nasal cannula oxygen Cardiovascular status: blood pressure returned to baseline and stable Postop Assessment: no apparent nausea or vomiting Anesthetic complications: no   No complications documented.  Last Vitals:  Vitals:   05/22/20 0455 05/22/20 1319  BP: 123/74 107/72  Pulse: 66 90  Resp: 18 16  Temp: 36.7 C 37.3 C  SpO2: 97% 96%    Last Pain:  Vitals:   05/22/20 1319  TempSrc: Oral  PainSc:                  Kennieth Rad

## 2020-05-22 NOTE — Progress Notes (Signed)
Inpatient Diabetes Program Recommendations  AACE/ADA: New Consensus Statement on Inpatient Glycemic Control (2015)  Target Ranges:  Prepandial:   less than 140 mg/dL      Peak postprandial:   less than 180 mg/dL (1-2 hours)      Critically ill patients:  140 - 180 mg/dL   Lab Results  Component Value Date   GLUCAP 114 (H) 05/22/2020   HGBA1C 7.7 (H) 05/19/2020    Review of Glycemic Control Results for LADD, CEN (MRN [id number]) as of 05/22/2020 12:17  Ref. Range 05/21/2020 09:01 05/21/2020 11:45 05/21/2020 16:05 05/21/2020 21:07 05/22/2020 08:05  Glucose-Capillary Latest Ref Range: 70 - 99 mg/dL 357 (H) 017 (H) 793 (H) 132 (H) 114 (H)   Diabetes history:  New onset DM2 Outpatient Diabetes medications:  None Current orders for Inpatient glycemic control:  Novolog 0-15 units tid  Metformin 500 mg bid  Note: Using in person spanish interpreter, spoke with pt and son at bedside.  Spoke with pt about new diagnosis. Discussed A1C results with them and explained what an A1C is, basic pathophysiology of DM Type 2, basic home care, basic diabetes diet nutrition principles, importance of checking CBGs and maintaining good CBG control to prevent long-term and short-term complications. Reviewed signs and symptoms of hyperglycemia and hypoglycemia and how to treat hypoglycemia at home. Also reviewed blood sugar goals at home.  RNs to provide ongoing basic DM education at bedside with this patient. Have ordered educational booklet, and DM videos. Have also placed RD consult for DM diet education for this patient.  Discussed current diet regime.  He drinks regular soda and does not watch his CHO intake.  Current A1C is 7.7%.  Discussed The Plate Method, CHO's and which foods contain CHO's.   Does not have insurance.  Provided him with a Relion glucometer.  Asked him to check his CBG's QAM.  He will also have HH for IV abx.   Per TOC, has an appointment with the [facility].  Asked him to bring  meter with him to appt so MD can review his blood sugar and make adjustments.   Discussed Metformin, how it works and side effects.    Attached education to AVS.    All questions answered.    Will continue to follow while inpatient.  Thank you, Dulce Sellar, RN, BSN Diabetes Coordinator Inpatient Diabetes Program [phone] (team pager from 8a-5p)

## 2020-05-22 NOTE — Progress Notes (Signed)
Physical Therapy Treatment Patient Details Name: Nathaniel Parks MRN: [id number] DOB: 1946-08-11 Today's Date: 05/22/2020    History of Present Illness 74 yo male s/p  Right knee excisional debridement, abscess, prepatellar bursa, with irrigation and debridement septic prepatellar bursitis with placement of wound VAC on 7/27.    PT Comments    Pt making excellent progress towards his physical therapy goals. Session focused on gait/stair training prior to discharge home. Pt ambulating 100 feet with no assistive device at a supervision level. Negotiated 2 steps with railing utilizing step by step technique. Education provided regarding precautions, immobilizer use, cryotherapy, elevation. Pt/pt family with no further questions or concerns.     Follow Up Recommendations  No PT follow up;Supervision for mobility/OOB     Equipment Recommendations  None recommended by PT    Recommendations for Other Services       Precautions / Restrictions Precautions Precautions: None Required Braces or Orthoses: Knee Immobilizer - Right Knee Immobilizer - Right: On when out of bed or walking Restrictions Weight Bearing Restrictions: Yes RLE Weight Bearing: Weight bearing as tolerated    Mobility  Bed Mobility               General bed mobility comments: Sitting on EOB upon arrival  Transfers Overall transfer level: Independent Equipment used: None                Ambulation/Gait Ambulation/Gait assistance: Supervision Gait Distance (Feet): 100 Feet Assistive device: None Gait Pattern/deviations: Step-through pattern;Antalgic;Decreased stance time - right;Decreased step length - left     General Gait Details: Slow, steady pace with decreased reciprocal arm swing. Cues for decreased right step length   Stairs Stairs: Yes Stairs assistance: Supervision Stair Management: One rail Left Number of Stairs: 2 General stair comments: Cues for sequencing, step by step  pattern   Wheelchair Mobility    Modified Rankin (Stroke Patients Only)       Balance Overall balance assessment: Mild deficits observed, not formally tested                                          Cognition Arousal/Alertness: Awake/alert Behavior During Therapy: WFL for tasks assessed/performed Overall Cognitive Status: Within Functional Limits for tasks assessed                                        Exercises      General Comments        Pertinent Vitals/Pain Pain Assessment: Faces Faces Pain Scale: Hurts a little bit Pain Location: R knee Pain Descriptors / Indicators: Discomfort Pain Intervention(s): Monitored during session    Home Living                      Prior Function            PT Goals (current goals can now be found in the care plan section) Acute Rehab PT Goals Patient Stated Goal: did not state Potential to Achieve Goals: Good Progress towards PT goals: Progressing toward goals    Frequency    Min 3X/week      PT Plan Current plan remains appropriate    Co-evaluation              AM-PAC PT "6 Clicks" Mobility  Outcome Measure  Help needed turning from your back to your side while in a flat bed without using bedrails?: None Help needed moving from lying on your back to sitting on the side of a flat bed without using bedrails?: None Help needed moving to and from a bed to a chair (including a wheelchair)?: None Help needed standing up from a chair using your arms (e.g., wheelchair or bedside chair)?: None Help needed to walk in hospital room?: None Help needed climbing 3-5 steps with a railing? : None 6 Click Score: 24    End of Session Equipment Utilized During Treatment: Gait belt;Right knee immobilizer Activity Tolerance: Patient tolerated treatment well Patient left: in bed;with call bell/phone within reach;with family/visitor present Nurse Communication: Mobility status PT  Visit Diagnosis: Other abnormalities of gait and mobility (R26.89);Difficulty in walking, not elsewhere classified (R26.2)     Time: 7262-0355 PT Time Calculation (min) (ACUTE ONLY): 15 min  Charges:  $Gait Training: 8-22 mins                       Lillia Pauls, PT, DPT Acute Rehabilitation Services Pager [phone] Office [phone]    Norval Morton 05/22/2020, 2:35 PM

## 2020-05-28 ENCOUNTER — Inpatient Hospital Stay (INDEPENDENT_AMBULATORY_CARE_PROVIDER_SITE_OTHER): Payer: Self-pay | Admitting: Primary Care

## 2020-06-11 ENCOUNTER — Inpatient Hospital Stay: Payer: Self-pay | Admitting: Family

## 2020-06-18 ENCOUNTER — Telehealth: Payer: Self-pay

## 2020-06-18 NOTE — Telephone Encounter (Signed)
Nathaniel Parks with [facility] called office today for pull picc order. States patient is scheduled to end antibiotics today. Hospital note does state to pull picc after last dose, but patient no showed follow up appointment. Home health would like to confirm it is okay to pull picc. Will ask Np if patient needs to reschedule missed appt. Lorenso Courier, [place]

## 2020-06-18 NOTE — Telephone Encounter (Signed)
Yes, okay to pull PICC. Please have him reschedule appointment with me or Dr. Daiva Eves.  Thanks!

## 2020-08-31 ENCOUNTER — Encounter: Payer: Self-pay | Admitting: Family Medicine

## 2020-08-31 ENCOUNTER — Other Ambulatory Visit: Payer: Self-pay

## 2020-08-31 ENCOUNTER — Ambulatory Visit (INDEPENDENT_AMBULATORY_CARE_PROVIDER_SITE_OTHER): Payer: Self-pay | Admitting: Family Medicine

## 2020-08-31 VITALS — BP 147/78 | HR 66 | Temp 98.7°F | Resp 17 | Ht 63.0 in | Wt 147.8 lb

## 2020-08-31 DIAGNOSIS — Z0000 Encounter for general adult medical examination without abnormal findings: Secondary | ICD-10-CM

## 2020-08-31 DIAGNOSIS — Z23 Encounter for immunization: Secondary | ICD-10-CM

## 2020-08-31 DIAGNOSIS — Z7689 Persons encountering health services in other specified circumstances: Secondary | ICD-10-CM

## 2020-08-31 DIAGNOSIS — M71161 Other infective bursitis, right knee: Secondary | ICD-10-CM

## 2020-08-31 DIAGNOSIS — Z9889 Other specified postprocedural states: Secondary | ICD-10-CM

## 2020-08-31 DIAGNOSIS — Z09 Encounter for follow-up examination after completed treatment for conditions other than malignant neoplasm: Secondary | ICD-10-CM

## 2020-08-31 LAB — POCT GLYCOSYLATED HEMOGLOBIN (HGB A1C)
HbA1c POC (<> result, manual entry): 5.5 % (ref 4.0–5.6)
HbA1c, POC (controlled diabetic range): 5.5 % (ref 0.0–7.0)
HbA1c, POC (prediabetic range): 5.5 % — AB (ref 5.7–6.4)
Hemoglobin A1C: 5.5 % (ref 4.0–5.6)

## 2020-08-31 LAB — POCT URINALYSIS DIPSTICK
Bilirubin, UA: NEGATIVE
Blood, UA: NEGATIVE
Glucose, UA: NEGATIVE
Ketones, UA: NEGATIVE
Leukocytes, UA: NEGATIVE
Nitrite, UA: NEGATIVE
Protein, UA: NEGATIVE
Spec Grav, UA: >=1.03 — AB (ref 1.010–1.025)
Urobilinogen, UA: 0.2 E.U./dL
pH, UA: 5.5 (ref 5.0–8.0)

## 2020-08-31 LAB — GLUCOSE, POCT (MANUAL RESULT ENTRY): POC Glucose: 109 mg/dl — AB (ref 70–99)

## 2020-08-31 NOTE — Progress Notes (Signed)
[facility]    New Patient--Hospital Follow Up--Establish  Subjective:  Patient ID: Nathaniel Parks, male    DOB: 12/21/1945  Age: 74 y.o. MRN: [id number]  CC:  Chief Complaint  Patient presents with  . Establish Care    HPI Nathaniel Parks is a 74 year old male who presents for Hospital Follow Up and to Establish Care today.    Patient Active Problem List   Diagnosis Date Noted  . Septic prepatellar bursitis of right knee 05/18/2020   Current Status: This will be Nathaniel Parks's initial office visit with me. He was not previously seeing a Physician regularly for his PCP needs. Since his last office visit, he has been Hospitalized from 05/18/2020-05/22/2020 for Septic Prepatellar Bursitis of Right Knee. Today, he is doing well with no complaints. He is ambulating well and reports that he is pain free at this time. He denies any other pain today. He is accompanied by his grand-daughter today. We will use video interpreter today. He denies fevers, chills, fatigue, recent infections, weight loss, and night sweats. He has not had any headaches, visual changes, dizziness, and falls. No chest pain, heart palpitations, cough and shortness of breath reported. No reports of GI problems such as nausea, vomiting, diarrhea, and constipation. He has no reports of blood in stools, dysuria and hematuria. No depression or anxiety reported today. He is taking all medications as prescribed.  Past Medical History:  Diagnosis Date  . Pre-diabetes     Past Surgical History:  Procedure Laterality Date  . I & D EXTREMITY Right 05/21/2020   Procedure: IRRIGATION AND DEBRIDEMENT EXTREMITY;  Surgeon: Bjorn PippinVarkey, Dax T, MD;  Location: [facility] OR;  Service: Orthopedics;  Laterality: Right;  . IRRIGATION AND DEBRIDEMENT KNEE Right 05/19/2020   Procedure: IRRIGATION AND DEBRIDEMENT KNEE;  Surgeon: Teryl LucyLandau, Joshua, MD;  Location: [facility] OR;  Service: Orthopedics;  Laterality: Right;  . KNEE  ARTHROSCOPY Right   . KNEE BURSECTOMY Right 05/19/2020   Procedure: KNEE BURSECTOMY;  Surgeon: Teryl LucyLandau, Joshua, MD;  [facility];  Service: Orthopedics;  Laterality: Right;  . KNEE BURSECTOMY Right 05/21/2020   Procedure: KNEE BURSECTOMY;  Surgeon: Bjorn PippinVarkey, Dax T, MD;  [facility];  Service: Orthopedics;  Laterality: Right;    History reviewed. No pertinent family history.  Social History   Socioeconomic History  . Marital status: Married    Spouse name: Not on file  . Number of children: Not on file  . Years of education: Not on file  . Highest education level: Not on file  Occupational History  . Not on file  Tobacco Use  . Smoking status: Never Smoker  . Smokeless tobacco: Never Used  Vaping Use  . Vaping Use: Never used  Substance and Sexual Activity  . Alcohol use: Not Currently  . Drug use: Never  . Sexual activity: Not on file  Other Topics Concern  . Not on file  Social History Narrative  . Not on file   Social Determinants of Health   Financial Resource Strain:   . Difficulty of Paying Living Expenses: Not on file  Food Insecurity:   . Worried About Programme researcher, broadcasting/film/videounning Out of Food in the Last Year: Not on file  . Ran Out of Food in the Last Year: Not on file  Transportation Needs:   . Lack of Transportation (Medical): Not on file  . Lack of Transportation (Non-Medical): Not on file  Physical Activity:   . Days of Exercise  per Week: Not on file  . Minutes of Exercise per Session: Not on file  Stress:   . Feeling of Stress : Not on file  Social Connections:   . Frequency of Communication with Friends and Family: Not on file  . Frequency of Social Gatherings with Friends and Family: Not on file  . Attends Religious Services: Not on file  . Active Member of Clubs or Organizations: Not on file  . Attends Banker Meetings: Not on file  . Marital Status: Not on file  Intimate Partner Violence:   . Fear of Current or Ex-Partner: Not on file  .  Emotionally Abused: Not on file  . Physically Abused: Not on file  . Sexually Abused: Not on file    Outpatient Medications Prior to Visit  Medication Sig Dispense Refill  . metFORMIN (GLUCOPHAGE) 500 MG tablet Take 1 tablet (500 mg total) by mouth 2 (two) times daily with a meal. 60 tablet 2  . metFORMIN (GLUCOPHAGE) 500 MG tablet Take 1 tablet (500 mg total) by mouth 2 (two) times daily with a meal. 60 tablet 11  . traMADol (ULTRAM) 50 MG tablet Take 50 mg by mouth 3 (three) times daily as needed for pain. (Patient not taking: Reported on 08/31/2020)     No facility-administered medications prior to visit.    No Known Allergies  ROS Review of Systems  Constitutional: Negative.   HENT: Negative.   Eyes: Negative.   Respiratory: Negative.   Cardiovascular: Negative.   Gastrointestinal: Negative.   Endocrine: Negative.   Genitourinary: Negative.   Musculoskeletal: Negative.   Skin: Negative.       Objective:    Physical Exam Vitals and nursing note reviewed.  Constitutional:      Appearance: Normal appearance.  HENT:     Head: Normocephalic and atraumatic.     Nose: Nose normal.     Mouth/Throat:     Mouth: Mucous membranes are moist.     Pharynx: Oropharynx is clear.  Cardiovascular:     Rate and Rhythm: Normal rate and regular rhythm.     Pulses: Normal pulses.     Heart sounds: Normal heart sounds.  Pulmonary:     Effort: Pulmonary effort is normal.     Breath sounds: Normal breath sounds.  Abdominal:     General: Bowel sounds are normal.     Palpations: Abdomen is soft.  Musculoskeletal:     Cervical back: Normal range of motion and neck supple.     Comments: Decreased ROM in right knee  Skin:    General: Skin is warm and dry.  Neurological:     General: No focal deficit present.     Mental Status: He is alert and oriented to person, place, and time.  Psychiatric:        Mood and Affect: Mood normal.        Behavior: Behavior normal.        Thought  Content: Thought content normal.        Judgment: Judgment normal.     BP (!) 147/78 (BP Location: Left Arm, Patient Position: Sitting, Cuff Size: Normal)   Pulse 66   Temp 98.7 F (37.1 C)   Resp 17   Ht 5\' 3"  (1.6 m)   Wt 147 lb 12.8 oz (67 kg)   SpO2 100%   BMI 26.18 kg/m  Wt Readings from Last 3 Encounters:  08/31/20 147 lb 12.8 oz (67 kg)  05/19/20 160 lb 11.5  oz (72.9 kg)  05/12/20 149 lb 14.6 oz (68 kg)     Health Maintenance Due  Topic Date Due  . URINE MICROALBUMIN  Never done  . COVID-19 Vaccine (1) Never done  . TETANUS/TDAP  Never done  . COLONOSCOPY  Never done  . PNA vac Low Risk Adult (1 of 2 - PCV13) Never done  . INFLUENZA VACCINE  Never done    There are no preventive care reminders to display for this patient.  No results found for: TSH Lab Results  Component Value Date   WBC 4.9 05/22/2020   HGB 9.4 (L) 05/22/2020   HCT 29.2 (L) 05/22/2020   MCV 95.4 05/22/2020   PLT 336 05/22/2020   Lab Results  Component Value Date   NA 137 05/22/2020   K 4.0 05/22/2020   CO2 25 05/22/2020   GLUCOSE 272 (H) 05/22/2020   BUN 15 05/22/2020   CREATININE 0.95 05/22/2020   CALCIUM 8.2 (L) 05/22/2020   ANIONGAP 9 05/22/2020   No results found for: CHOL No results found for: HDL No results found for: LDLCALC No results found for: TRIG No results found for: CHOLHDL Lab Results  Component Value Date   HGBA1C 5.5 08/31/2020   HGBA1C 5.5 08/31/2020   HGBA1C 5.5 (A) 08/31/2020   HGBA1C 5.5 08/31/2020      Assessment & Plan:   1. Hospital discharge follow-up  2. Establishing care with new doctor, encounter for - Urinalysis Dipstick - POC Glucose (CBG) - POC HgB A1c - Flu Vaccine QUAD 6+ mos PF IM (Fluarix Quad PF) - Tdap vaccine greater than or equal to 7yo IM  3. Septic prepatellar bursitis of right knee  4. Status post right knee surgery Stable. No pain. Well healed.   5. Healthcare maintenance - CBC with Differential - Comprehensive  metabolic panel - Lipid Panel - TSH - PSA - Vitamin B12 - Vitamin D, 25-hydroxy  6. Follow up He will follow up in 6 months.   No orders of the defined types were placed in this encounter.   Orders Placed This Encounter  Procedures  . Flu Vaccine QUAD 6+ mos PF IM (Fluarix Quad PF)  . Tdap vaccine greater than or equal to 7yo IM  . CBC with Differential  . Comprehensive metabolic panel  . Lipid Panel  . TSH  . PSA  . Vitamin B12  . Vitamin D, 25-hydroxy  . Urinalysis Dipstick  . POC Glucose (CBG)  . POC HgB A1c    Referral Orders  No referral(s) requested today    Raliegh Ip,  MSN, FNP-BC [facility] [address] [place], [place] [address] [phone] fax  Problem List Items Addressed This Visit      Musculoskeletal and Integument   Septic prepatellar bursitis of right knee    Other Visit Diagnoses    Hospital discharge follow-up    -  Primary   Establishing care with new doctor, encounter for       Relevant Orders   Urinalysis Dipstick (Completed)   POC Glucose (CBG) (Completed)   POC HgB A1c (Completed)   Flu Vaccine QUAD 6+ mos PF IM (Fluarix Quad PF)   Tdap vaccine greater than or equal to 7yo IM   Status post right knee surgery       Healthcare maintenance       Relevant Orders   CBC with Differential   Comprehensive metabolic panel  Lipid Panel   TSH   PSA   Vitamin B12   Vitamin D, 25-hydroxy   Follow up          No orders of the defined types were placed in this encounter.   Follow-up: No follow-ups on file.    Kallie Locks, FNP

## 2020-09-01 LAB — CBC WITH DIFFERENTIAL/PLATELET
Basophils Absolute: 0 10*3/uL (ref 0.0–0.2)
Basos: 1 %
EOS (ABSOLUTE): 0.1 10*3/uL (ref 0.0–0.4)
Eos: 3 %
Hematocrit: 39.6 % (ref 37.5–51.0)
Hemoglobin: 13 g/dL (ref 13.0–17.7)
Immature Grans (Abs): 0 10*3/uL (ref 0.0–0.1)
Immature Granulocytes: 0 %
Lymphocytes Absolute: 2 10*3/uL (ref 0.7–3.1)
Lymphs: 38 %
MCH: 27.8 pg (ref 26.6–33.0)
MCHC: 32.8 g/dL (ref 31.5–35.7)
MCV: 85 fL (ref 79–97)
Monocytes Absolute: 0.4 10*3/uL (ref 0.1–0.9)
Monocytes: 8 %
Neutrophils Absolute: 2.7 10*3/uL (ref 1.4–7.0)
Neutrophils: 50 %
Platelets: 302 10*3/uL (ref 150–450)
RBC: 4.67 x10E6/uL (ref 4.14–5.80)
RDW: 12.8 % (ref 11.6–15.4)
WBC: 5.3 10*3/uL (ref 3.4–10.8)

## 2020-09-01 LAB — LIPID PANEL
Chol/HDL Ratio: 3.9 ratio (ref 0.0–5.0)
Cholesterol, Total: 212 mg/dL — ABNORMAL HIGH (ref 100–199)
HDL: 54 mg/dL (ref >39)
LDL Chol Calc (NIH): 118 mg/dL — ABNORMAL HIGH (ref 0–99)
Triglycerides: 230 mg/dL — ABNORMAL HIGH (ref 0–149)
VLDL Cholesterol Cal: 40 mg/dL (ref 5–40)

## 2020-09-01 LAB — PSA: Prostate Specific Ag, Serum: 1.9 ng/mL (ref 0.0–4.0)

## 2020-09-01 LAB — COMPREHENSIVE METABOLIC PANEL
ALT: 14 IU/L (ref 0–44)
AST: 18 IU/L (ref 0–40)
Albumin/Globulin Ratio: 1.6 (ref 1.2–2.2)
Albumin: 4.3 g/dL (ref 3.7–4.7)
Alkaline Phosphatase: 109 IU/L (ref 44–121)
BUN/Creatinine Ratio: 30 — ABNORMAL HIGH (ref 10–24)
BUN: 23 mg/dL (ref 8–27)
Bilirubin Total: <0.2 mg/dL (ref 0.0–1.2)
CO2: 23 mmol/L (ref 20–29)
Calcium: 9.7 mg/dL (ref 8.6–10.2)
Chloride: 105 mmol/L (ref 96–106)
Creatinine, Ser: 0.77 mg/dL (ref 0.76–1.27)
GFR calc Af Amer: 103 mL/min/{1.73_m2} (ref >59)
GFR calc non Af Amer: 89 mL/min/{1.73_m2} (ref >59)
Globulin, Total: 2.7 g/dL (ref 1.5–4.5)
Glucose: 99 mg/dL (ref 65–99)
Potassium: 4.5 mmol/L (ref 3.5–5.2)
Sodium: 144 mmol/L (ref 134–144)
Total Protein: 7 g/dL (ref 6.0–8.5)

## 2020-09-01 LAB — VITAMIN D 25 HYDROXY (VIT D DEFICIENCY, FRACTURES): Vit D, 25-Hydroxy: 31.1 ng/mL (ref 30.0–100.0)

## 2020-09-01 LAB — VITAMIN B12: Vitamin B-12: 967 pg/mL (ref 232–1245)

## 2020-09-01 LAB — TSH: TSH: 6.1 u[IU]/mL — ABNORMAL HIGH (ref 0.450–4.500)

## 2020-09-04 ENCOUNTER — Telehealth: Payer: Self-pay | Admitting: Family Medicine

## 2020-09-04 NOTE — Telephone Encounter (Signed)
Attempted to contact patient via Hispanic Interpreter Line [phone]) to review labs today. Message left for patient to contact office to review.

## 2021-02-17 ENCOUNTER — Telehealth: Payer: Self-pay | Admitting: Clinical

## 2021-02-17 NOTE — Telephone Encounter (Signed)
[facility] Case Management Referral Note  02/17/2021 Name: Nathaniel Parks MRN: [id number] DOB: 17-Apr-1946 Nathaniel Parks is a 75 y.o. year old male who sees Kallie Locks, FNP (Inactive) for primary care. LCSW was consulted to assess patient's needs and assist the patient with [employer] .  Interpreter: No.   Interpreter Name & Language: patient speaks Spanish, his son assisted with interpretation over the phone  Assessment: Patient experiencing Financial constraints related to lack of health coverage.  Intervention: Patient's daughter in law was present at the [facility]) today and requested assistance with the [employer] and [employer] for patient. She indicated they had tried to contact the [facility] department but hadn't heard back. She indicated they have the application and required documents. CSW provided contact information and patient's son called with patient later today. CSW introduced self to patient on the phone and explained how and where to turn in [employer] and [employer]. CSW called over to [facility]) together with patient and son to schedule a financial counseling appointment, but was unable to reach staff. CSW will follow up to assist in scheduling this appointment for patient.  Review of patient status, including review of consultants reports, relevant laboratory and other test results, and collaboration with appropriate care team members and the patient's provider was performed as part of comprehensive patient evaluation and provision of services.    SDOH (Social Determinants of Health) assessments performed: No   Goals Addressed   None      Follow up Plan: 1. CSW to follow and assist in scheduling with financial counseling at [facility]  Abigail Butts, LCSW [facility] [phone]

## 2021-02-18 ENCOUNTER — Telehealth: Payer: Self-pay | Admitting: Clinical

## 2021-02-18 NOTE — Telephone Encounter (Signed)
[facility] General Follow Up Note  02/18/2021 Name: Yazen Rosko MRN: [id number] DOB: [birth year] Oshua Mcconaha is a 75 y.o. year old male who sees Kallie Locks, FNP (Inactive) for primary care. LCSW was initially consulted to assess patient's needs and assist the patient with [employer].  Interpreter: No.   Interpreter Name & Language: none  Assessment: Patient experiencing financial constraints related to lack of health coverage.  Ongoing Intervention: Today CSW coordinated with [employer] [facility]) and had patient scheduled for financial counseling appointment there on 03/01/21. Patient is also scheduled for PCP appointment here at [facility]) the same day. Called patient's son and advised him of these appointments, as they will assist patient with transportation. CSW available for support as needed.  Review of patient status, including review of consultants reports, relevant laboratory and other test results, and collaboration with appropriate care team members and the patient's provider was performed as part of comprehensive patient evaluation and provision of services.     Follow up Plan: 1. CSW available from clinic as needed.  Abigail Butts, LCSW [facility] [phone]

## 2021-03-01 ENCOUNTER — Ambulatory Visit (INDEPENDENT_AMBULATORY_CARE_PROVIDER_SITE_OTHER): Payer: Self-pay | Admitting: Nurse Practitioner

## 2021-03-01 ENCOUNTER — Ambulatory Visit: Payer: Self-pay | Admitting: Family Medicine

## 2021-03-01 ENCOUNTER — Ambulatory Visit: Payer: Self-pay

## 2021-03-01 ENCOUNTER — Other Ambulatory Visit: Payer: Self-pay

## 2021-03-01 ENCOUNTER — Encounter: Payer: Self-pay | Admitting: Nurse Practitioner

## 2021-03-01 VITALS — BP 140/57 | HR 58 | Ht 63.0 in | Wt 148.0 lb

## 2021-03-01 DIAGNOSIS — R351 Nocturia: Secondary | ICD-10-CM

## 2021-03-01 DIAGNOSIS — N401 Enlarged prostate with lower urinary tract symptoms: Secondary | ICD-10-CM

## 2021-03-01 DIAGNOSIS — R7303 Prediabetes: Secondary | ICD-10-CM

## 2021-03-01 DIAGNOSIS — E785 Hyperlipidemia, unspecified: Secondary | ICD-10-CM

## 2021-03-01 DIAGNOSIS — R7989 Other specified abnormal findings of blood chemistry: Secondary | ICD-10-CM

## 2021-03-01 LAB — POCT GLYCOSYLATED HEMOGLOBIN (HGB A1C): HbA1c POC (<> result, manual entry): 5.6 % (ref 4.0–5.6)

## 2021-03-01 MED ORDER — TAMSULOSIN HCL 0.4 MG PO CAPS: 0.4000 mg | ORAL_CAPSULE | Freq: Every day | ORAL | 11 refills | Status: AC

## 2021-03-01 NOTE — Patient Instructions (Signed)
Hiperplasia prosttica benigna Benign Prostatic Hyperplasia  La hiperplasia prosttica benigna (HBP) es un aumento del tamao de la prstata que es causado por el proceso de envejecimiento normal y no por el cncer. La prstata es una glndula del tamao de una nuez que participa en la produccin de semen. Est ubicada frente al recto y debajo de la vejiga. La vejiga almacena orina, y la uretra es el tubo que transporta la orina desde la vejiga hasta el exterior del cuerpo. La prstata puede aumentar de tamao a medida que un hombre envejece. Una prstata agrandada puede presionar la uretra. Esto puede dificultar el pasaje de la orina. La acumulacin de orina en la vejiga puede causar una infeccin. La presin y la infeccin pueden provocar daos en la vejiga y una insuficiencia en los riones (renal). Cules son las causas? Esta afeccin es una parte normal del proceso de envejecimiento. Sin embargo, no todos los hombres desarrollan problemas por esta afeccin. Si la prstata se agranda lejos de la uretra, el flujo de orina no se obstruir. Si se agranda hacia la uretra y la comprime, habr problemas con el paso de la orina. Qu incrementa el riesgo? Es ms probable que esta afeccin se manifieste en hombres mayores de 50aos. Cules son los signos o los sntomas? Los sntomas de esta afeccin incluyen:  Levantarse con frecuencia durante la noche para orinar.  Necesidad de orinar con ms frecuencia durante el da.  Dificultad para comenzar a eliminar la orina.  Disminucin del tamao y de la fuerza del chorro de orina.  Prdida (goteo) luego de orinar.  Imposibilidad para orinar. En este caso, es necesario un tratamiento inmediato.  Imposibilidad para vaciar la vejiga completamente.  Dolor al orinar. Esto es ms comn si tambin hay una infeccin.  Infeccin de las vas urinarias (IU). Cmo se diagnostica? Esta afeccin se diagnostica en funcin de los antecedentes mdicos, un  examen fsico y los sntomas. Tambin se le realizarn pruebas, por ejemplo:  Un estudio despus de vaciar la vejiga. Este mide la cantidad de orina que queda en la vejiga despus de terminar de orinar.  Examen rectal digital. En un examen rectal, el mdico controla la prstata colocando un dedo lubricado y enguantado en el recto para sentir la parte posterior de la prstata. Este examen detecta el tamao de la glndula y si hay algn bulto o crecimiento anormal.  Anlisis de orina (urinlisis).  Estudio de antgeno prosttico especfico (PSA). Este es un anlisis de sangre que se utiliza para diagnosticar el cncer de prstata.  Una ecografa. En este estudio, se utilizan ondas de sonido para producir de manera electrnica una imagen de la prstata. El mdico puede derivarlo a un especialista en enfermedades del rin y de la prstata (urlogo). Cmo se trata? Una vez que los sntomas comienzan, el mdico controlar la afeccin (vigilancia activa u observacin cautelosa). El tratamiento depender de la gravedad de la afeccin. El tratamiento puede incluir:  Observacin y exmenes anuales. Este puede ser el nico tratamiento necesario si la afeccin y los sntomas son leves.  Medicamentos para aliviar los sntomas, entre los que se incluyen los siguientes: ? Medicamentos para achicar la prstata. ? Medicamentos para relajar el msculo de la prstata.  Ciruga, solo cuando los casos son graves. La ciruga puede incluir lo siguiente: ? Prostatectoma. En este procedimiento, se extrae el tejido de la prstata completamente, a travs de una incisin abierta, con un laparoscopio o con robtica. ? Reseccin transuretral de la prstata (RTUP). En este   procedimiento, se inserta una herramienta a travs de la abertura en la punta del pene (uretra). Se utiliza para cortar tejido del centro interior de la prstata. Los trozos se retiran a Games developer de la misma abertura del pene. De este modo, se libera la  obstruccin. ? Incisin transuretral (ITUP). En este procedimiento, se hacen pequeos cortes en la prstata. Esto alivia la presin de la prstata sobre la uretra. ? Termoterapia transuretral con microondas (TTUM). En este procedimiento, se utilizan microondas para Facilities manager. El calor destruye y extirpa Neomia Dear pequea cantidad de tejido prosttico. ? Ablacin transuretral con aguja (ATUA). En este procedimiento, se utiliza la radiofrecuencia para destruir y extirpar una pequea cantidad de tejido prosttico. ? Coagulacin intersticial con lser (CIL). En este procedimiento, se utiliza un lser para destruir y extirpar una pequea cantidad de tejido prosttico. ? Electrovaporizacin transuretral (EVTU). En este procedimiento, se utilizan electrodos para destruir y extirpar una pequea cantidad de tejido prosttico. ? Liberacin uretral prosttica. En este procedimiento, se inserta un implante para ejercer presin en los lbulos de la prstata, en direccin contraria a la uretra. Siga estas indicaciones en su casa:  Tome los medicamentos de venta libre y los recetados solamente como se lo haya indicado el mdico.  Controle si hay cambios en los sntomas. Hable con su mdico antes de hacer cualquier cambio.  Evite beber grandes cantidades de lquido antes de irse a la cama o de salir de su casa.  Evite o reduzca la cantidad de cafena o alcohol que consume.  Tmese tiempo para orinar.  Concurra a todas las visitas de [address] se lo haya indicado el mdico. Esto es importante. Comunquese con un mdico si:  Siente dolor en la espalda sin explicacin.  Los sntomas no mejoran con Scientist, research (medical).  Los [employer] causan efectos secundarios.  La orina se vuelve muy oscura o tiene mal olor.  Siente que la parte inferior del abdomen est distendida y tiene problemas para Geographical information systems officer. Solicite ayuda inmediatamente si:  Tiene fiebre o escalofros.  De repente no [employer].  Se  siente mareado, ligeramente aturdido o se desmaya.  Observa gran cantidad de sangre o cogulos sanguneos en la orina.  Sus problemas urinarios se vuelven difciles de Chief Operating Officer.  Siente dolor moderado a intenso en la espalda o en la fosa lumbar. La fosa lumbar es el costado del cuerpo entre las costillas y la cadera. Estos sntomas pueden representar un problema grave que constituye Radio broadcast assistant. No espere a ver si los sntomas desaparecen. Solicite atencin mdica de inmediato. Comunquese con el servicio de emergencias de su localidad (911 en los [place]). No conduzca por sus propios medios [employer] hospital. Resumen  La hiperplasia prosttica benigna (HBP) es un aumento del tamao de la prstata que es causado por el proceso de envejecimiento normal y no por Management consultant.  Una prstata agrandada puede presionar la uretra. Esto puede dificultar el paso de la [place].  Esta afeccin es parte del proceso de envejecimiento normal y es ms probable que se manifieste en hombres mayores de [id number].  Obtenga atencin de inmediato si de repente no puede orinar. Esta informacin no tiene Theme park manager el consejo del mdico. Asegrese de hacerle al mdico cualquier pregunta que tenga. Document Revised: 07/31/2020 Document Reviewed: 06/29/2020 Elsevier Patient Education  2021 [employer].

## 2021-03-01 NOTE — Progress Notes (Signed)
[facility] Patient [facility] [address] [place], [place]  [address] Phone:  [phone]   Fax:  [phone]   Established Patient Office Visit  Subjective:  Patient ID: Nathaniel Parks, male    DOB: [birth year]  Age: 75 y.o. MRN: [id number]  CC:  Chief Complaint  Patient presents with  . Follow-up    6 month follow up; no questions or concerns.     HPI Nathaniel Parks presents for follow up. He  has a past medical history of Pre-diabetes.   Diabetes Mellitus Patient presents prediabetes Current symptoms include: visual disturbances related to cataracts. Symptoms have stabilized. Patient denies foot ulcerations, hyperglycemia, increased appetite, nausea, paresthesia of the feet, polydipsia, polyuria, vomiting and weight loss. Evaluation to date has included: hemoglobin A1C.  Home sugars: BGs range between 100 and 130. Current treatment: more intensive attention to diet which has been effective. Last dilated eye exam: 2021.   Past Medical History:  Diagnosis Date  . Pre-diabetes     Past Surgical History:  Procedure Laterality Date  . I & D EXTREMITY Right 05/21/2020   Procedure: IRRIGATION AND DEBRIDEMENT EXTREMITY;  Surgeon: Bjorn Pippin, MD;  Location: [facility] OR;  Service: Orthopedics;  Laterality: Right;  . IRRIGATION AND DEBRIDEMENT KNEE Right 05/19/2020   Procedure: IRRIGATION AND DEBRIDEMENT KNEE;  Surgeon: Teryl Lucy, MD;  Location: [facility] OR;  Service: Orthopedics;  Laterality: Right;  . KNEE ARTHROSCOPY Right   . KNEE BURSECTOMY Right 05/19/2020   Procedure: KNEE BURSECTOMY;  Surgeon: Teryl Lucy, MD;  Location: [facility] OR;  Service: Orthopedics;  Laterality: Right;  . KNEE BURSECTOMY Right 05/21/2020   Procedure: KNEE BURSECTOMY;  Surgeon: Bjorn Pippin, MD;  Location: [facility] OR;  Service: Orthopedics;  Laterality: Right;    No family history on file.  Social History   Socioeconomic History  . Marital status: Married    Spouse name: Not on file  . Number of children: Not on  file  . Years of education: Not on file  . Highest education level: Not on file  Occupational History  . Not on file  Tobacco Use  . Smoking status: Never Smoker  . Smokeless tobacco: Never Used  Vaping Use  . Vaping Use: Never used  Substance and Sexual Activity  . Alcohol use: Not Currently  . Drug use: Never  . Sexual activity: Not on file  Other Topics Concern  . Not on file  Social History Narrative  . Not on file   Social Determinants of Health   Financial Resource Strain: Not on file  Food Insecurity: Not on file  Transportation Needs: Not on file  Physical Activity: Not on file  Stress: Not on file  Social Connections: Not on file  Intimate Partner Violence: Not on file    Outpatient Medications Prior to Visit  Medication Sig Dispense Refill  . metFORMIN (GLUCOPHAGE) 500 MG tablet Take 1 tablet (500 mg total) by mouth 2 (two) times daily with a meal. (Patient not taking: Reported on 03/01/2021) 60 tablet 2  . metFORMIN (GLUCOPHAGE) 500 MG tablet Take 1 tablet (500 mg total) by mouth 2 (two) times daily with a meal. (Patient not taking: Reported on 03/01/2021) 60 tablet 11  . traMADol (ULTRAM) 50 MG tablet Take 50 mg by mouth 3 (three) times daily as needed for pain. (Patient not taking: No sig reported)     No facility-administered medications prior to visit.    No Known Allergies  ROS Review of Systems  Constitutional: Negative.   HENT: Negative.   Eyes: Positive for visual disturbance (blurred right eye vision).  Respiratory: Negative for shortness of breath.   Cardiovascular: Negative for chest pain.  Gastrointestinal: Negative for constipation, diarrhea and nausea.  Endocrine: Negative.   Genitourinary:       4-5 times at night with urgency for greater than one year Denies hesitation or change in stream    Musculoskeletal: Negative.       Objective:    Physical Exam Constitutional:      General: He is not in acute distress.    Appearance: He is  not ill-appearing, toxic-appearing or diaphoretic.  HENT:     Head: Normocephalic and atraumatic.     Nose: Nose normal.     Mouth/Throat:     Mouth: Mucous membranes are moist.  Cardiovascular:     Rate and Rhythm: Normal rate and regular rhythm.     Pulses: Normal pulses.     Heart sounds: Normal heart sounds.  Pulmonary:     Effort: Pulmonary effort is normal.     Breath sounds: Normal breath sounds.  Abdominal:     Palpations: Abdomen is soft.  Musculoskeletal:     Cervical back: Normal range of motion.     Right lower leg: No edema.     Left lower leg: No edema.  Skin:    General: Skin is warm and dry.     Capillary Refill: Capillary refill takes less than 2 seconds.  Neurological:     General: No focal deficit present.     Mental Status: He is alert and oriented to person, place, and time.  Psychiatric:        Mood and Affect: Mood normal.        Behavior: Behavior normal.        Thought Content: Thought content normal.        Judgment: Judgment normal.     BP (!) 140/57 (BP Location: Left Arm)   Pulse (!) 58   Ht 5\' 3"  (1.6 m)   Wt 148 lb 0.2 oz (67.1 kg)   SpO2 100%   BMI 26.22 kg/m  Wt Readings from Last 3 Encounters:  03/01/21 148 lb 0.2 oz (67.1 kg)  08/31/20 147 lb 12.8 oz (67 kg)  05/19/20 160 lb 11.5 oz (72.9 kg)     Health Maintenance Due  Topic Date Due  . URINE MICROALBUMIN  Never done    There are no preventive care reminders to display for this patient.  Lab Results  Component Value Date   TSH 6.100 (H) 08/31/2020   Lab Results  Component Value Date   WBC 5.3 08/31/2020   HGB 13.0 08/31/2020   HCT 39.6 08/31/2020   MCV 85 08/31/2020   PLT 302 08/31/2020   Lab Results  Component Value Date   NA 144 08/31/2020   K 4.5 08/31/2020   CO2 23 08/31/2020   GLUCOSE 99 08/31/2020   BUN 23 08/31/2020   CREATININE 0.77 08/31/2020   BILITOT <0.2 08/31/2020   ALKPHOS 109 08/31/2020   AST 18 08/31/2020   ALT 14 08/31/2020   PROT 7.0  08/31/2020   ALBUMIN 4.3 08/31/2020   CALCIUM 9.7 08/31/2020   ANIONGAP 9 05/22/2020   Lab Results  Component Value Date   CHOL 212 (H) 08/31/2020   Lab Results  Component Value Date   HDL 54 08/31/2020   Lab Results  Component Value Date   LDLCALC 118 (H) 08/31/2020  Lab Results  Component Value Date   TRIG 230 (H) 08/31/2020   Lab Results  Component Value Date   CHOLHDL 3.9 08/31/2020   Lab Results  Component Value Date   HGBA1C 5.6 03/01/2021      Assessment & Plan:   Problem List Items Addressed This Visit   None   Visit Diagnoses    Prediabetes    -  Primary Consider home glucose monitoring Weight loss at least 5% of current body weight is can be achieved with lifestyle modification dietary changes and regular daily exercise Encourage blood pressure control goal <120/80 and maintaining total cholesterol <200 Follow-up every 3 to 6 months for reevaluation Education material provided    Relevant Orders   Microalbumin, urine   Comp. Metabolic Panel (12)   POCT glycosylated hemoglobin (Hb A1C)   Elevated TSH       Relevant Orders   TSH   T3   T4, free   Elevated lipids       Relevant Orders   Lipid panel   BPH associated with nocturia     Trial flomax follow up in 3 months due ongoing and patient requesting 6 mon follow up.       Meds ordered this encounter  Medications  . tamsulosin (FLOMAX) 0.4 MG CAPS capsule    Sig: Take 1 capsule (0.4 mg total) by mouth daily.    Dispense:  30 capsule    Refill:  11    Order Specific Question:   Supervising Provider    Answer:   Quentin Angst [[id number]]    Follow-up: Return in about 3 months (around 06/01/2021) for follow up in BPH.    Barbette Merino, NP

## 2021-03-02 ENCOUNTER — Ambulatory Visit: Payer: Self-pay

## 2021-03-02 LAB — COMP. METABOLIC PANEL (12)
AST: 22 IU/L (ref 0–40)
Albumin/Globulin Ratio: 2 (ref 1.2–2.2)
Albumin: 4 g/dL (ref 3.7–4.7)
Alkaline Phosphatase: 93 IU/L (ref 44–121)
BUN/Creatinine Ratio: 19 (ref 10–24)
BUN: 16 mg/dL (ref 8–27)
Bilirubin Total: 0.2 mg/dL (ref 0.0–1.2)
Calcium: 9.2 mg/dL (ref 8.6–10.2)
Chloride: 106 mmol/L (ref 96–106)
Creatinine, Ser: 0.84 mg/dL (ref 0.76–1.27)
Globulin, Total: 2 g/dL (ref 1.5–4.5)
Glucose: 127 mg/dL — ABNORMAL HIGH (ref 65–99)
Potassium: 4.2 mmol/L (ref 3.5–5.2)
Sodium: 143 mmol/L (ref 134–144)
Total Protein: 6 g/dL (ref 6.0–8.5)
eGFR: 91 mL/min/{1.73_m2} (ref >59)

## 2021-03-02 LAB — LIPID PANEL
Chol/HDL Ratio: 3.2 ratio (ref 0.0–5.0)
Cholesterol, Total: 205 mg/dL — ABNORMAL HIGH (ref 100–199)
HDL: 65 mg/dL (ref >39)
LDL Chol Calc (NIH): 128 mg/dL — ABNORMAL HIGH (ref 0–99)
Triglycerides: 68 mg/dL (ref 0–149)
VLDL Cholesterol Cal: 12 mg/dL (ref 5–40)

## 2021-03-02 LAB — T4, FREE: Free T4: 1.15 ng/dL (ref 0.82–1.77)

## 2021-03-02 LAB — T3: T3, Total: 80 ng/dL (ref 71–180)

## 2021-03-02 LAB — TSH: TSH: 3.51 u[IU]/mL (ref 0.450–4.500)

## 2021-05-10 ENCOUNTER — Ambulatory Visit: Payer: Self-pay

## 2021-05-11 ENCOUNTER — Other Ambulatory Visit: Payer: Self-pay

## 2021-05-11 ENCOUNTER — Ambulatory Visit: Payer: Self-pay | Attending: Nurse Practitioner

## 2021-05-25 ENCOUNTER — Telehealth: Payer: Self-pay | Admitting: Family Medicine

## 2021-05-25 NOTE — Telephone Encounter (Signed)
Pt was sent a letter from financial dept. Inform them, that the application they submitted was incomplete, since they were missing some documentation at the time of the appointment, Pt need to reschedule and resubmit all new papers and application for [employer] and [place], P.S. old documents has been sent back by mail to the Pt and Pt. need to make a new appt. 

## 2021-06-09 ENCOUNTER — Ambulatory Visit: Payer: Self-pay | Admitting: Nurse Practitioner

## 2022-03-31 ENCOUNTER — Other Ambulatory Visit: Payer: Self-pay | Admitting: Nurse Practitioner

## 2022-04-18 IMAGING — DX DG KNEE COMPLETE 4+V*R*
4 series · 4 of 4 positions shown · non-contrast
Comparison: None.

CLINICAL DATA: Swelling without trauma

EXAM:
RIGHT KNEE - COMPLETE 4+ VIEW

[knee ap]
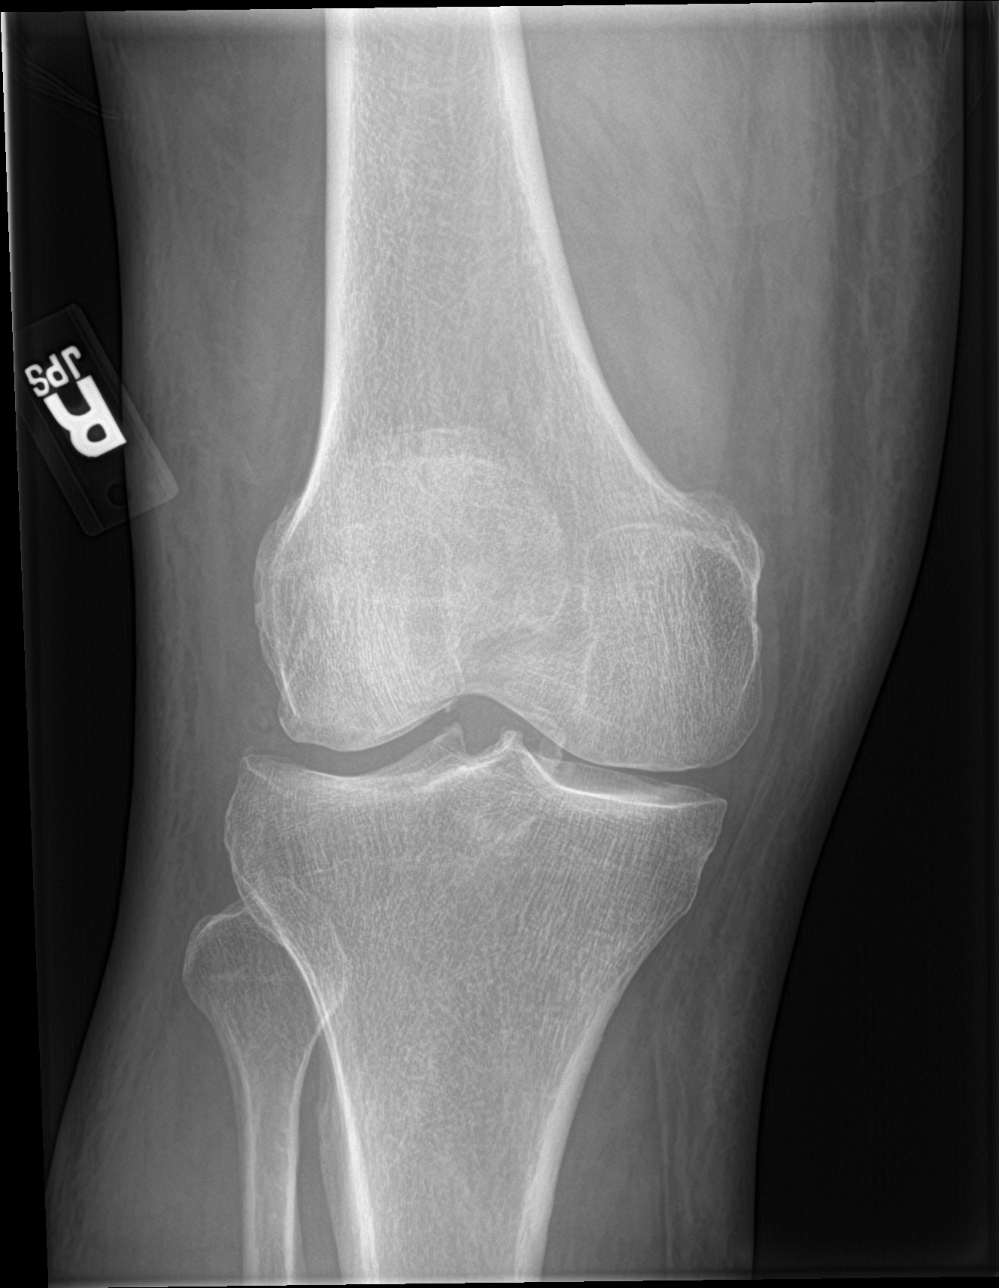

[knee lat]
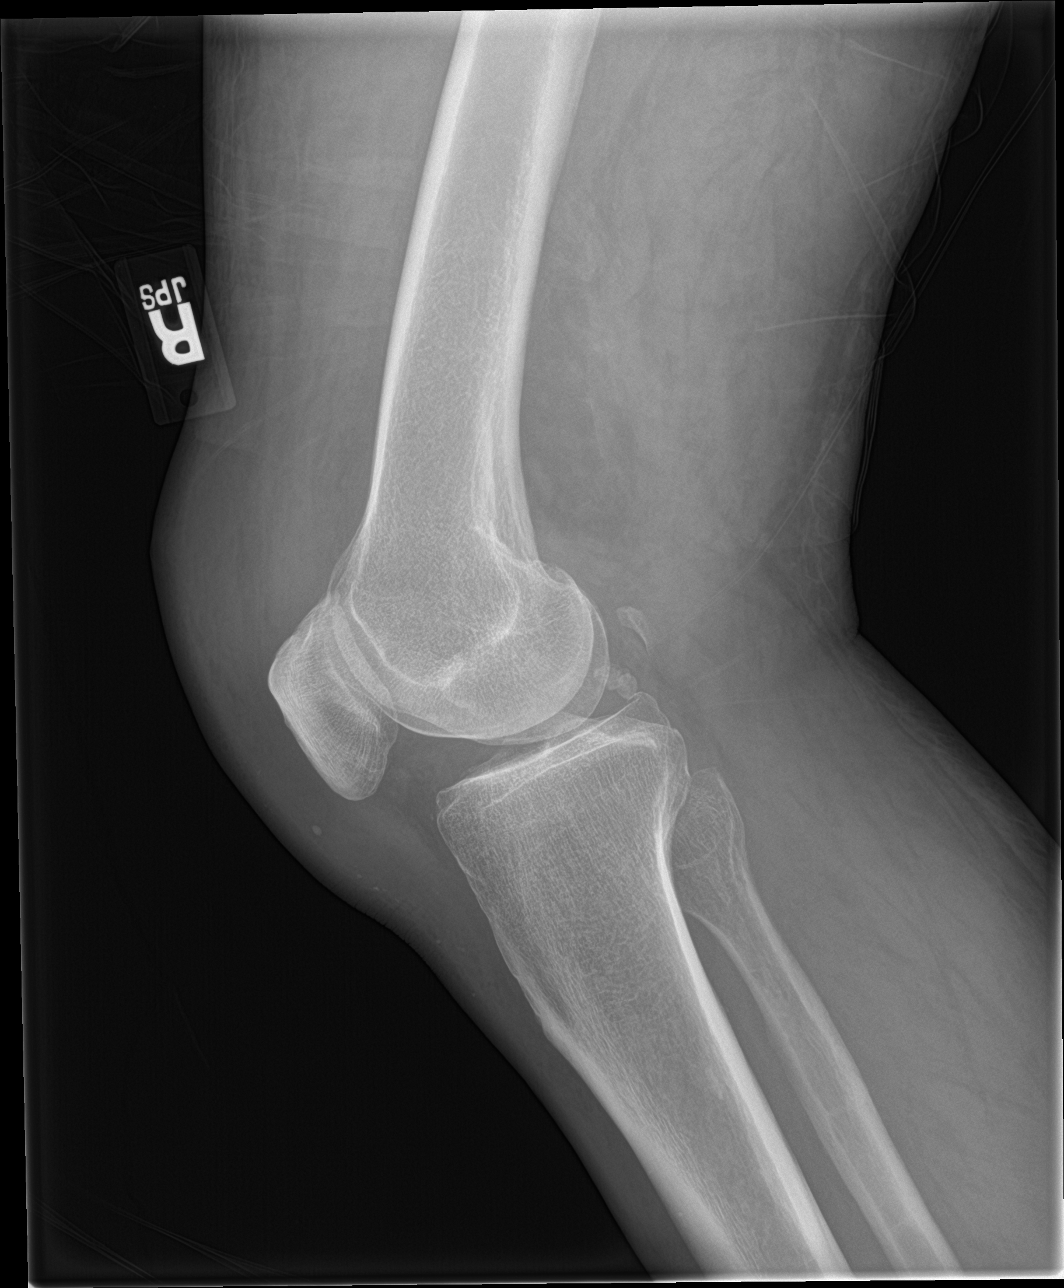

[knee obl (1 of 2)]
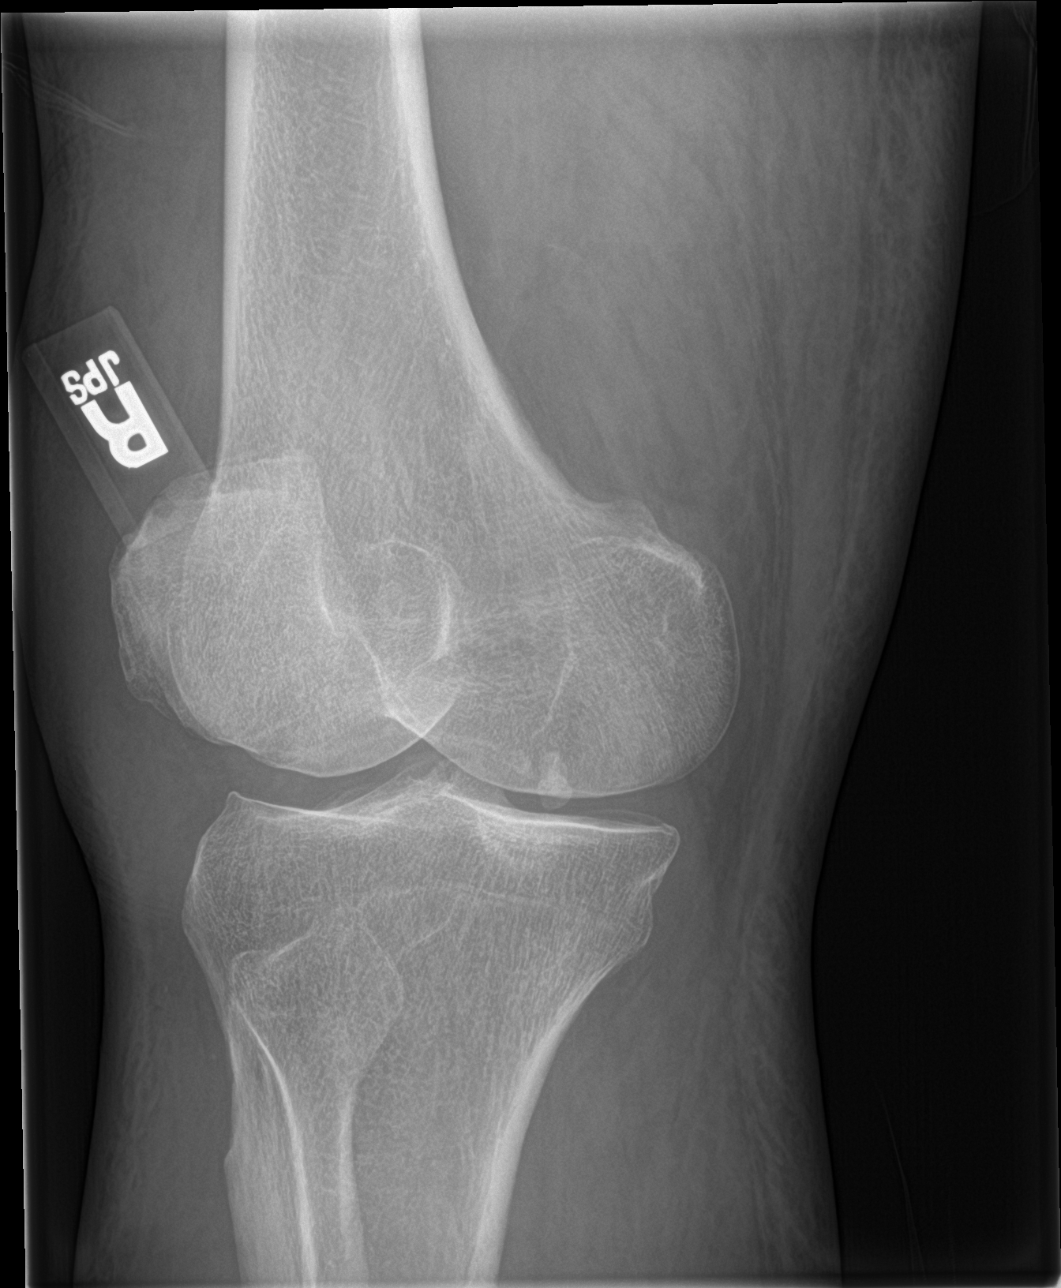

[knee obl (2 of 2)]
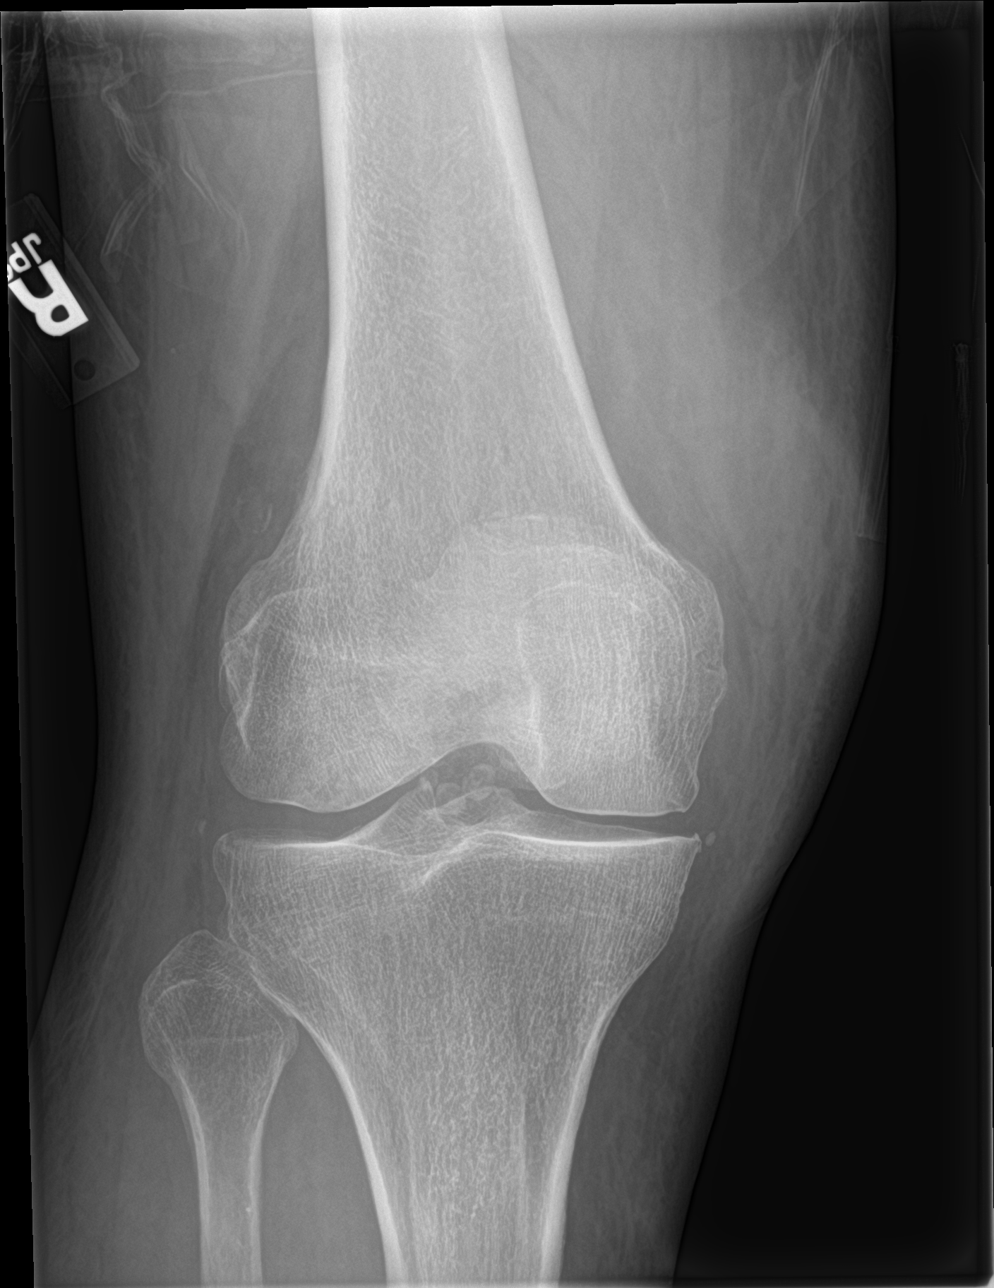

[4 of 4 positions shown; findings below may reference images not displayed]

FINDINGS: No acute fracture or dislocation. There are mild tricompartmental
degenerative changes with joint space narrowing and mild osteophyte
formation. Multiple well corticated ossific densities at the
posterior joint space likely reflecting fabellae versus sequela of
remote prior trauma. No area of erosion or osseous destruction. No
unexpected radiopaque foreign body. Soft tissue edema overlying the
patella and suprapatellar area without large joint effusion. No
subcutaneous air.
IMPRESSION: 1. Soft tissue edema overlying the patella and suprapatellar area
without large joint effusion.
2. No acute fracture or dislocation.
3. No radiographic evidence of osteomyelitis.

## 2023-03-08 ENCOUNTER — Emergency Department (HOSPITAL_COMMUNITY)
Admission: EM | Admit: 2023-03-08 | Discharge: 2023-03-08 | Disposition: A | Discharge status: Home / Self Care | Payer: Self-pay | Attending: Emergency Medicine | Admitting: Emergency Medicine

## 2023-03-08 ENCOUNTER — Emergency Department (HOSPITAL_COMMUNITY): Payer: Self-pay

## 2023-03-08 ENCOUNTER — Encounter (HOSPITAL_COMMUNITY): Payer: Self-pay

## 2023-03-08 DIAGNOSIS — M1712 Unilateral primary osteoarthritis, left knee: Secondary | ICD-10-CM | POA: Insufficient documentation

## 2023-03-08 DIAGNOSIS — E119 Type 2 diabetes mellitus without complications: Secondary | ICD-10-CM | POA: Insufficient documentation

## 2023-03-08 DIAGNOSIS — M7032 Other bursitis of elbow, left elbow: Secondary | ICD-10-CM

## 2023-03-08 DIAGNOSIS — Y9389 Activity, other specified: Secondary | ICD-10-CM | POA: Insufficient documentation

## 2023-03-08 DIAGNOSIS — Z7984 Long term (current) use of oral hypoglycemic drugs: Secondary | ICD-10-CM | POA: Insufficient documentation

## 2023-03-08 DIAGNOSIS — M7022 Olecranon bursitis, left elbow: Secondary | ICD-10-CM | POA: Insufficient documentation

## 2023-03-08 DIAGNOSIS — X58XXXA Exposure to other specified factors, initial encounter: Secondary | ICD-10-CM | POA: Insufficient documentation

## 2023-03-08 LAB — COMPREHENSIVE METABOLIC PANEL
ALT: 22 U/L (ref 0–44)
AST: 23 U/L (ref 15–41)
Albumin: 3.6 g/dL (ref 3.5–5.0)
Alkaline Phosphatase: 59 U/L (ref 38–126)
Anion gap: 9 (ref 5–15)
BUN: 19 mg/dL (ref 8–23)
CO2: 24 mmol/L (ref 22–32)
Calcium: 9.1 mg/dL (ref 8.9–10.3)
Chloride: 105 mmol/L (ref 98–111)
Creatinine, Ser: 0.78 mg/dL (ref 0.61–1.24)
GFR, Estimated: >60 mL/min (ref >60)
Glucose, Bld: 107 mg/dL — ABNORMAL HIGH (ref 70–99)
Potassium: 3.9 mmol/L (ref 3.5–5.1)
Sodium: 138 mmol/L (ref 135–145)
Total Bilirubin: 0.5 mg/dL (ref 0.3–1.2)
Total Protein: 6.7 g/dL (ref 6.5–8.1)

## 2023-03-08 LAB — CBC WITH DIFFERENTIAL/PLATELET
Abs Immature Granulocytes: 0.03 10*3/uL (ref 0.00–0.07)
Basophils Absolute: 0 10*3/uL (ref 0.0–0.1)
Basophils Relative: 1 %
Eosinophils Absolute: 0.1 10*3/uL (ref 0.0–0.5)
Eosinophils Relative: 1 %
HCT: 40.5 % (ref 39.0–52.0)
Hemoglobin: 13.2 g/dL (ref 13.0–17.0)
Immature Granulocytes: 1 %
Lymphocytes Relative: 16 %
Lymphs Abs: 1.1 10*3/uL (ref 0.7–4.0)
MCH: 30.4 pg (ref 26.0–34.0)
MCHC: 32.6 g/dL (ref 30.0–36.0)
MCV: 93.3 fL (ref 80.0–100.0)
Monocytes Absolute: 0.5 10*3/uL (ref 0.1–1.0)
Monocytes Relative: 8 %
Neutro Abs: 4.9 10*3/uL (ref 1.7–7.7)
Neutrophils Relative %: 73 %
Platelets: 240 10*3/uL (ref 150–400)
RBC: 4.34 MIL/uL (ref 4.22–5.81)
RDW: 12.4 % (ref 11.5–15.5)
WBC: 6.6 10*3/uL (ref 4.0–10.5)
nRBC: 0 % (ref 0.0–0.2)

## 2023-03-08 LAB — SYNOVIAL CELL COUNT + DIFF, W/ CRYSTALS
Crystals, Fluid: NONE SEEN
Eosinophils-Synovial: UNDETERMINED % (ref 0–1)
Lymphocytes-Synovial Fld: UNDETERMINED % (ref 0–20)
Monocyte-Macrophage-Synovial Fluid: UNDETERMINED % (ref 50–90)
Neutrophil, Synovial: UNDETERMINED % (ref 0–25)
Other Cells-SYN: UNDETERMINED
WBC, Synovial: UNDETERMINED /mm3 (ref 0–200)

## 2023-03-08 LAB — CBG MONITORING, ED: Glucose-Capillary: 106 mg/dL — ABNORMAL HIGH (ref 70–99)

## 2023-03-08 MED ORDER — DOXYCYCLINE HYCLATE 100 MG PO CAPS: 100.0000 mg | ORAL_CAPSULE | Freq: Two times a day (BID) | ORAL | 0 refills | Status: DC

## 2023-03-08 NOTE — ED Notes (Signed)
Update Son [phone]

## 2023-03-08 NOTE — ED Triage Notes (Addendum)
Pt declines interpreter, requests granddaughter translate for him. Pt reports he has had an abscess to left elbow for about 1 month. States the area has been intermittently draining for the last two weeks. PT AxOx4. Denies fever. Hx of pre diabetes   Pt also reports pain and swelling to left knee for the past several months. Denies injury

## 2023-03-08 NOTE — Discharge Instructions (Addendum)
It was a pleasure taking care of you today!   Your xray didn't show concern for fracture or dislocation. It did show concern for arthritis to your knee and swelling to your elbow. You will be sent a prescription for doxycyline, take as directed. Attached is information for the orthopedist, Dr. Steward Drone, call tomorrow to set up a follow up appointment for early next week. Follow up with your primary care provider regarding todays ED visit. Return to the ED if you are experiencing increasing/worsening swelling, redness, purulent drainage, fever, or worsening symptoms.   Fue un placer cuidarte hoy!  Su radiografa no mostr preocupacin por fractura o dislocacin. Mostr preocupacin por la artritis en la rodilla y la hinchazn del codo. Se le enviar una receta de doxiciclina; tmela segn las indicaciones. Adjunto informacin para que el ortopedista, Dr. Steward Drone, llame maana para programar una cita de seguimiento para principios de la prxima semana. Haga un seguimiento con su proveedor de atencin primaria sobre la visita al servicio de urgencias de hoy. Regrese al servicio de urgencias si experimenta hinchazn, enrojecimiento, drenaje purulento, fiebre o sntomas que empeoran o [place] o [place].

## 2023-03-08 NOTE — ED Provider Notes (Cosign Needed)
[place] EMERGENCY DEPARTMENT AT [facility] Provider Note   CSN: [id number] Arrival date & time: 03/08/23  1225     History  No chief complaint on file.   Nathaniel Parks is a 77 y.o. male with a PMHx of septic arthritis, DM who presents to the ED with concerns for left elbow pain x 1 month. Notes mild pain to the area. Denies any new clothes, lotions, soaps, detergents. Denies insect bite. No meds tried at home. Denies numbness, tingling, fever, chest pain, shortness of breath.  Denies his pain feeling similar to when he was admitted in 2021 for knee septic arthritis.    Also notes left knee pain without injury. Notes pain with walking. Denies recent injury, trauma, fall.    The history is provided by the patient. A language interpreter was used (spanish).       Home Medications Prior to Admission medications   Medication Sig Start Date End Date Taking? Authorizing Provider  doxycycline (VIBRAMYCIN) 100 MG capsule Take 1 capsule (100 mg total) by mouth 2 (two) times daily. 03/08/23  Yes Roanne Haye A, PA-C  metFORMIN (GLUCOPHAGE) 500 MG tablet Take 1 tablet (500 mg total) by mouth 2 (two) times daily with a meal. Patient not taking: Reported on 03/01/2021 05/12/20   Terrilee Files, MD  metFORMIN (GLUCOPHAGE) 500 MG tablet Take 1 tablet (500 mg total) by mouth 2 (two) times daily with a meal. Patient not taking: Reported on 03/01/2021 05/22/20 05/22/21  Armida Sans, PA-C  traMADol (ULTRAM) 50 MG tablet Take 50 mg by mouth 3 (three) times daily as needed for pain. Patient not taking: No sig reported 05/15/20   [provider]      Allergies    Patient has no known allergies.    Review of Systems   Review of Systems  All other systems reviewed and are negative.   Physical Exam Updated Vital Signs BP (!) 152/88 (BP Location: Right Arm)   Pulse 64   Temp 97.7 F (36.5 C) (Oral)   Resp 19   SpO2 98%  Physical Exam Vitals and nursing note  reviewed.  Constitutional:      General: He is not in acute distress.    Appearance: Normal appearance.  Eyes:     General: No scleral icterus.    Extraocular Movements: Extraocular movements intact.  Cardiovascular:     Rate and Rhythm: Normal rate.  Pulmonary:     Effort: Pulmonary effort is normal. No respiratory distress.  Abdominal:     Palpations: Abdomen is soft. There is no mass.     Tenderness: There is no abdominal tenderness.  Musculoskeletal:        General: Normal range of motion.     Cervical back: Neck supple.     Comments: Mild increased warmth noted to left olecranon with erythema.  Patient with full active range of motion of left elbow without assistance or difficulty.  Radial pulse intact.  Grip strength 5/5. See picture below.  Skin:    General: Skin is warm and dry.     Findings: No rash.  Neurological:     Mental Status: He is alert.     Sensory: Sensation is intact.     Motor: Motor function is intact.  Psychiatric:        Behavior: Behavior normal.        ED Results / Procedures / Treatments   Labs (all labs ordered are listed, but only abnormal results  are displayed) Labs Reviewed  COMPREHENSIVE METABOLIC PANEL - Abnormal; Notable for the following components:      Result Value   Glucose, Bld 107 (*)    All other components within normal limits  SYNOVIAL CELL COUNT + DIFF, W/ CRYSTALS - Abnormal; Notable for the following components:   Color, Synovial RED (*)    Appearance-Synovial TURBID (*)    All other components within normal limits  CBG MONITORING, ED - Abnormal; Notable for the following components:   Glucose-Capillary 106 (*)    All other components within normal limits  BODY FLUID CULTURE W GRAM STAIN  CBC WITH DIFFERENTIAL/PLATELET    EKG None  Radiology DG Elbow Complete Left  Result Date: 03/08/2023 CLINICAL DATA:  Patient reports has and abscess to the left elbow for about a month. EXAM: LEFT ELBOW - COMPLETE 3+ VIEW  COMPARISON:  None Available. FINDINGS: There is no evidence of fracture or dislocation. No cortical erosion or periosteal reaction. Moderate-sized elbow joint effusion. There is soft tissue swelling and subcutaneous soft tissue edema about the posterior aspect of the elbow. IMPRESSION: 1. Moderate-sized elbow joint effusion. If there is clinical concern for septic arthritis, orthopedic consultation and joint aspiration for further management is suggested. 2. Soft tissue swelling and subcutaneous soft tissue edema about the posterior aspect of the elbow. 3. No evidence of osteomyelitis. Electronically Signed   By: Larose Hires D.O.   On: 03/08/2023 13:55   DG Knee Complete 4 Views Left  Result Date: 03/08/2023 CLINICAL DATA:  Left knee pain. EXAM: LEFT KNEE - COMPLETE 4+ VIEW COMPARISON:  None Available. FINDINGS: No evidence of fracture or dislocation. Tricompartmental knee joint space narrowing prominent in the medial tibiofemoral and patellofemoral compartments. Moderate size suprapatellar knee joint effusion, likely reactive. Soft tissues are unremarkable. IMPRESSION: 1. No fracture or dislocation. 2. Tricompartmental knee osteoarthritis, prominent in the medial tibiofemoral and patellofemoral compartments with moderate-sized joint effusion. Electronically Signed   By: Larose Hires D.O.   On: 03/08/2023 13:52    Procedures Procedures    Medications Ordered in ED Medications - No data to display  ED Course/ Medical Decision Making/ A&P Clinical Course as of 03/08/23 1932  Wed Mar 08, 2023  1505 Consult with Charma Igo who recommends aspiration of likely bursitis and discharge home with antibiotics and follow up with Dr. Steward Drone next week.  [SB]  [id number] Discussed with patient plans for discharge home with antibiotics. Discussed with patient via his granddaughter that it is important that he takes the medications as prescribed to him. Answered all available questions. Pt appears safe for  discharge at this time.  [SB]    Clinical Course User Index [SB] Brylynn Hanssen A, PA-C                             Medical Decision Making Amount and/or Complexity of Data Reviewed Labs: ordered. Radiology: ordered.  Risk Prescription drug management.   Patient with left elbow pain onset 1 month.  No known injury. Vital signs patient afebrile. On exam, patient with Mild increased warmth noted to left olecranon with erythema.  Patient with full active range of motion of left elbow without assistance or difficulty.  Radial pulse intact.  Grip strength 5/5.  Differential diagnosis includes septic arthritis, bursitis, cellulitis, abscess.   Additional history obtained:  Additional history obtained from granddaughter External records from outside source obtained and reviewed including: Patient was evaluated emergency department for bursitis  of the right knee on 05/12/2020.  At that time the area was aspirated he was sent home with a prescription for doxycycline.  Patient was then admitted to the hospital 05/18/2020 for septic bursitis.  Labs:  I ordered, and personally interpreted labs.  The pertinent results include:   CBC and CMP unremarkable Body fluid culture with Gram stain and synovial fluid count obtained overall unremarkable.  Unable to obtain complete aspiration sample due to patient synovial fluid prior to procedure shooting out onto the bed  Imaging: I ordered imaging studies including left elbow, left knee I independently visualized and interpreted imaging which showed: Left elbow with  1. Moderate-sized elbow joint effusion. If there is clinical concern  for septic arthritis, orthopedic consultation and joint aspiration  for further management is suggested.  2. Soft tissue swelling and subcutaneous soft tissue edema about the  posterior aspect of the elbow.  3. No evidence of osteomyelitis.  Left knee with  1. No fracture or dislocation.  2. Tricompartmental knee  osteoarthritis, prominent in the medial  tibiofemoral and patellofemoral compartments with moderate-sized  joint effusion.   I agree with the radiologist interpretation  Consultations: I requested consultation with the orthopedic PA Earney Hamburg, and discussed lab and imaging findings as well as pertinent plan - they recommend: Recommends aspiration of likely bursitis and discharged home with antibiotics.  Also recommends patient follow-up with orthopedist Dr. Steward Drone next week   Disposition: Presenting suspicious for bursitis of left elbow and arthritis of left knee.  Doubt concerns at this time for septic arthritis, patient afebrile, no leukocytosis noted on exam today.  Patient with full active range of motion of all extremities without assistance or difficulty.  Doubt concerns this time for cellulitis or abscess.  After consideration of the diagnostic results and the patients response to treatment, I feel that the patient would benefit from Discharge home.  Patient discharged home with doxycycline prescription.  Also instructed to follow-up with orthopedist regarding today's ED visit.  Instructed to return to the emergency department for fever, worsening swelling, decreased range of motion, purulent drainage. Supportive care measures and strict return precautions discussed with patient at bedside. Pt acknowledges and verbalizes understanding. Pt appears safe for discharge. Follow up as indicated in discharge paperwork.    This chart was dictated using voice recognition software, Dragon. Despite the best efforts of this provider to proofread and correct errors, errors may still occur which can change documentation meaning.  Final Clinical Impression(s) / ED Diagnoses Final diagnoses:  Bursitis of left elbow, unspecified bursa  Arthritis of left knee    Rx / DC Orders ED Discharge Orders          Ordered    doxycycline (VIBRAMYCIN) 100 MG capsule  2 times daily        03/08/23 1641               Ladesha Pacini A, PA-C 03/08/23 1932

## 2023-03-09 LAB — BODY FLUID CULTURE W GRAM STAIN: Culture: NO GROWTH

## 2023-03-11 LAB — BODY FLUID CULTURE W GRAM STAIN

## 2023-03-14 ENCOUNTER — Ambulatory Visit (INDEPENDENT_AMBULATORY_CARE_PROVIDER_SITE_OTHER): Payer: Self-pay | Admitting: Physician Assistant

## 2023-03-14 ENCOUNTER — Encounter: Payer: Self-pay | Admitting: Physician Assistant

## 2023-03-14 DIAGNOSIS — M7022 Olecranon bursitis, left elbow: Secondary | ICD-10-CM | POA: Insufficient documentation

## 2023-03-14 MED ORDER — DOXYCYCLINE HYCLATE 100 MG PO CAPS: 100.0000 mg | ORAL_CAPSULE | Freq: Two times a day (BID) | ORAL | 0 refills | Status: DC

## 2023-03-14 NOTE — Progress Notes (Signed)
Office Visit Note   Patient: Nathaniel Parks           Date of Birth: 05/08/1946           MRN: [id number] Visit Date: 03/14/2023              Requested by: Kallie Locks, FNP [id number] [place],  [place] [address] PCP: Kallie Locks, FNP   Assessment & Plan: Visit Diagnoses:  1. Olecranon bursitis, left elbow     Plan: Patient is a 77 year old gentleman with history of diabetes.  He is Spanish-speaking only but he is accompanied today by family member and an interpreter.  He has a 1 month history of drainage from his left elbow.  Denies any injury.  Denies any fever or chills.  Denies any trauma.  The elbow began to get a little more red so he presented to the emergency room on 515.  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  X-rays of his elbow did not show any evidence of osteomyelitis or degenerative changes.  He otherwise appeared well and was placed on doxycycline 1 p.o. twice daily.  Cultures were obtained.  Cultures demonstrated no growth.  Pictures were taken in the emergency room and he has no erythema today.  He still has a 2 mm open wound area that when flexing his elbow expresses clear fluid.  No sign of purulence no sign of fluctuance.  He does say it feels a lot better.  I do have concerns for the length of time this has been going on and potential for infection once the antibiotics are stopped.  The patient was referred to Dr. Steward Drone,.  I will have him follow-up with he or his PA next week for reevaluation.  He has been given strict return precautions.  I have also asked that he continue the doxycycline.  I have also given him some dressing supplies.  He should be doing antibacterial soap close in his daily  Follow-Up Instructions: Return in about 1 week (around 03/21/2023), or With Dr. Steward Drone.   Orders:  No orders of the defined types were placed in this encounter.  Meds ordered this encounter  Medications   doxycycline (VIBRAMYCIN) 100  MG capsule    Sig: Take 1 capsule (100 mg total) by mouth 2 (two) times daily.    Dispense:  20 capsule    Refill:  0      Procedures: No procedures performed   Clinical Data: No additional findings.   Subjective: No chief complaint on file.   HPI patient is a pleasant 77 year old diabetic who is seen today with the help of a Spanish interpreter.  He presents with a 5-week history of drainage from his left elbow.  Denies any injury or any trauma.  Denies any fever or chills.  Was seen in the emergency room about a week ago as his elbow was beginning to be a little painful and red.  Aspiration was done and cultures were sent.  These cultures did not show any growth at 3 days.  He was placed on a course of doxycycline twice a day.  Erythema and pain have decreased but he still has serous clear drainage especially with flexion of his elbow  Review of Systems  All other systems reviewed and are negative.    Objective: Vital Signs: There were no vitals taken for this visit.  Physical Exam Constitutional:      Appearance: Normal  appearance.  Pulmonary:     Effort: Pulmonary effort is normal.  Skin:    General: Skin is warm and dry.  Neurological:     Mental Status: He is alert.     Ortho Exam Examination of his left elbow he has active and passive extension and flexion without any difficulty.  No pain.  No fluctuance noted.  No erythema or cellulitis.  He is neurovascular intact distally he does have a 2 mm open wound area and with flexion expresses a small amount of clear fluid.  There is no evidence of any purulence or infective process currently Specialty Comments:  No specialty comments available.  Imaging: No results found.   PMFS History: Patient Active Problem List   Diagnosis Date Noted   Olecranon bursitis, left elbow 03/14/2023   Septic prepatellar bursitis of right knee 05/18/2020   Past Medical History:  Diagnosis Date   Pre-diabetes     History  reviewed. No pertinent family history.  Past Surgical History:  Procedure Laterality Date   I & D EXTREMITY Right 05/21/2020   Procedure: IRRIGATION AND DEBRIDEMENT EXTREMITY;  Surgeon: Bjorn Pippin, MD;  Location: [facility];  Service: Orthopedics;  Laterality: Right;   IRRIGATION AND DEBRIDEMENT KNEE Right 05/19/2020   Procedure: IRRIGATION AND DEBRIDEMENT KNEE;  Surgeon: Teryl Lucy, MD;  Location: [facility];  Service: Orthopedics;  Laterality: Right;   KNEE ARTHROSCOPY Right    KNEE BURSECTOMY Right 05/19/2020   Procedure: KNEE BURSECTOMY;  Surgeon: Teryl Lucy, MD;  Location: [facility];  Service: Orthopedics;  Laterality: Right;   KNEE BURSECTOMY Right 05/21/2020   Procedure: KNEE BURSECTOMY;  Surgeon: Bjorn Pippin, MD;  Location: [facility];  Service: Orthopedics;  Laterality: Right;   Social History   Occupational History   Not on file  Tobacco Use   Smoking status: Never   Smokeless tobacco: Never  Vaping Use   Vaping Use: Never used  Substance and Sexual Activity   Alcohol use: Not Currently   Drug use: Never   Sexual activity: Not on file

## 2023-03-22 ENCOUNTER — Encounter (HOSPITAL_BASED_OUTPATIENT_CLINIC_OR_DEPARTMENT_OTHER): Payer: Self-pay | Admitting: Student

## 2023-03-22 ENCOUNTER — Ambulatory Visit (INDEPENDENT_AMBULATORY_CARE_PROVIDER_SITE_OTHER): Payer: Self-pay | Admitting: Student

## 2023-03-22 DIAGNOSIS — M7022 Olecranon bursitis, left elbow: Secondary | ICD-10-CM

## 2023-03-22 MED ORDER — CEPHALEXIN 500 MG PO CAPS: 500.0000 mg | ORAL_CAPSULE | Freq: Four times a day (QID) | ORAL | 0 refills | Status: AC

## 2023-03-22 NOTE — Progress Notes (Signed)
Chief Complaint: Left elbow swelling and drainage     History of Present Illness:    Nathaniel Parks is a 77 y.o. male presents today for follow-up of pain and drainage from the left elbow.  He is here today with his granddaughter who is providing translation.  Overall he states that his elbow is about the same since his last visit 1 week ago.  He continues to have yellow drainage from the elbow which has slightly lessened.  He has completed the course of doxycycline.  Denies any fever, chills, or redness around the area.  Does also state that his left knee has continued to be painful and got x-rays 2 weeks ago.   Surgical History:   None  PMH/PSH/Family History/Social History/Meds/Allergies:    Past Medical History:  Diagnosis Date   Pre-diabetes    Past Surgical History:  Procedure Laterality Date   I & D EXTREMITY Right 05/21/2020   Procedure: IRRIGATION AND DEBRIDEMENT EXTREMITY;  Surgeon: Bjorn Pippin, MD;  Location: [facility];  Service: Orthopedics;  Laterality: Right;   IRRIGATION AND DEBRIDEMENT KNEE Right 05/19/2020   Procedure: IRRIGATION AND DEBRIDEMENT KNEE;  Surgeon: Teryl Lucy, MD;  Location: [facility];  Service: Orthopedics;  Laterality: Right;   KNEE ARTHROSCOPY Right    KNEE BURSECTOMY Right 05/19/2020   Procedure: KNEE BURSECTOMY;  Surgeon: Teryl Lucy, MD;  Location: [facility];  Service: Orthopedics;  Laterality: Right;   KNEE BURSECTOMY Right 05/21/2020   Procedure: KNEE BURSECTOMY;  Surgeon: Bjorn Pippin, MD;  Location: [facility];  Service: Orthopedics;  Laterality: Right;   Social History   Socioeconomic History   Marital status: Married    Spouse name: Not on file   Number of children: Not on file   Years of education: Not on file   Highest education level: Not on file  Occupational History   Not on file  Tobacco Use   Smoking status: Never   Smokeless tobacco: Never  Vaping Use   Vaping Use: Never used  Substance and  Sexual Activity   Alcohol use: Not Currently   Drug use: Never   Sexual activity: Not on file  Other Topics Concern   Not on file  Social History Narrative   Not on file   Social Determinants of Health   Financial Resource Strain: Not on file  Food Insecurity: Not on file  Transportation Needs: Not on file  Physical Activity: Not on file  Stress: Not on file  Social Connections: Not on file   History reviewed. No pertinent family history. No Known Allergies Current Outpatient Medications  Medication Sig Dispense Refill   cephALEXin (KEFLEX) 500 MG capsule Take 1 capsule (500 mg total) by mouth 4 (four) times daily for 5 days. 20 capsule 0   doxycycline (VIBRAMYCIN) 100 MG capsule Take 1 capsule (100 mg total) by mouth 2 (two) times daily. 20 capsule 0   metFORMIN (GLUCOPHAGE) 500 MG tablet Take 1 tablet (500 mg total) by mouth 2 (two) times daily with a meal. (Patient not taking: Reported on 03/01/2021) 60 tablet 2   metFORMIN (GLUCOPHAGE) 500 MG tablet Take 1 tablet (500 mg total) by mouth 2 (two) times daily with a meal. (Patient not taking: Reported on 03/01/2021) 60 tablet 11   traMADol (ULTRAM) 50 MG tablet Take 50  mg by mouth 3 (three) times daily as needed for pain. (Patient not taking: No sig reported)     No current facility-administered medications for this visit.   No results found.  Review of Systems:   A ROS was performed including pertinent positives and negatives as documented in the HPI.  Physical Exam :   Constitutional: NAD and appears stated age Neurological: Alert and oriented Psych: Appropriate affect and cooperative There were no vitals taken for this visit.   Comprehensive Musculoskeletal Exam:    Active range of motion of the left elbow from 120 degrees flexion to 0 degrees extension.  There is swelling over the olecranon without erythema.  There is a small wound approximately 2 mm in size that is well-appearing.  Yellow serous fluid is able to be  expressed from the wound.  Imaging:     I personally reviewed and interpreted the radiographs.   Assessment:   77 y.o. male with left olecranon bursitis.  This has continued to drain however he notes that the amount has somewhat decreased.  There are no current signs of infection however I would like to restart him on 1 more course of antibiotics to ensure infection does not develop.  Will plan on following up next week for reevaluation and at that point can consider incision and drainage if needed.  Return precautions discussed.  Plan :    -Return to clinic in 1 week     I personally saw and evaluated the patient, and participated in the management and treatment plan.  Hazle Nordmann, PA-C Orthopedics  This document was dictated using Conservation officer, historic buildings. A reasonable attempt at proof reading has been made to minimize errors.

## 2023-03-29 ENCOUNTER — Encounter (HOSPITAL_BASED_OUTPATIENT_CLINIC_OR_DEPARTMENT_OTHER): Payer: Self-pay | Admitting: Student

## 2023-03-29 ENCOUNTER — Ambulatory Visit (INDEPENDENT_AMBULATORY_CARE_PROVIDER_SITE_OTHER): Payer: Self-pay | Admitting: Student

## 2023-03-29 DIAGNOSIS — M1712 Unilateral primary osteoarthritis, left knee: Secondary | ICD-10-CM

## 2023-03-29 DIAGNOSIS — M7022 Olecranon bursitis, left elbow: Secondary | ICD-10-CM

## 2023-03-29 MED ORDER — LIDOCAINE HCL 1 % IJ SOLN
4.0000 mL | INTRAMUSCULAR | Status: AC | PRN
Start: 2023-03-29 — End: 2023-03-29
  Administered 2023-03-29: 4 mL

## 2023-03-29 MED ORDER — TRIAMCINOLONE ACETONIDE 40 MG/ML IJ SUSP
2.0000 mL | INTRAMUSCULAR | Status: AC | PRN
Start: 2023-03-29 — End: 2023-03-29
  Administered 2023-03-29: 2 mL via INTRA_ARTICULAR

## 2023-03-29 NOTE — Progress Notes (Signed)
Chief Complaint: Left elbow swelling and left knee pain     History of Present Illness:    Nathaniel Parks is a 77 y.o. male presenting today for follow-up evaluation of left elbow bursitis as well as left knee pain.  States that while his elbow has still been slightly swollen and draining, this has decreased noticeably since last week.  Still is able to express some clear, yellow fluid once or twice a day.  He did just finish his course of Keflex.  Denies any redness around the elbow, fever, or chills.  Patient also states that his left knee has been swelling and painful for a few months now.  Denies any injury.  He did get x-rays of the knee about 3 weeks ago while in emergency department which showed significant tricompartmental osteoarthritis.  He has not tried any treatment up to this point.   Surgical History:   None  PMH/PSH/Family History/Social History/Meds/Allergies:    Past Medical History:  Diagnosis Date   Pre-diabetes    Past Surgical History:  Procedure Laterality Date   I & D EXTREMITY Right 05/21/2020   Procedure: IRRIGATION AND DEBRIDEMENT EXTREMITY;  Surgeon: Bjorn Pippin, MD;  Location: [facility];  Service: Orthopedics;  Laterality: Right;   IRRIGATION AND DEBRIDEMENT KNEE Right 05/19/2020   Procedure: IRRIGATION AND DEBRIDEMENT KNEE;  Surgeon: Teryl Lucy, MD;  Location: [facility];  Service: Orthopedics;  Laterality: Right;   KNEE ARTHROSCOPY Right    KNEE BURSECTOMY Right 05/19/2020   Procedure: KNEE BURSECTOMY;  Surgeon: Teryl Lucy, MD;  Location: [facility];  Service: Orthopedics;  Laterality: Right;   KNEE BURSECTOMY Right 05/21/2020   Procedure: KNEE BURSECTOMY;  Surgeon: Bjorn Pippin, MD;  Location: [facility];  Service: Orthopedics;  Laterality: Right;   Social History   Socioeconomic History   Marital status: Married    Spouse name: Not on file   Number of children: Not on file   Years of education: Not on file   Highest  education level: Not on file  Occupational History   Not on file  Tobacco Use   Smoking status: Never   Smokeless tobacco: Never  Vaping Use   Vaping Use: Never used  Substance and Sexual Activity   Alcohol use: Not Currently   Drug use: Never   Sexual activity: Not on file  Other Topics Concern   Not on file  Social History Narrative   Not on file   Social Determinants of Health   Financial Resource Strain: Not on file  Food Insecurity: Not on file  Transportation Needs: Not on file  Physical Activity: Not on file  Stress: Not on file  Social Connections: Not on file   History reviewed. No pertinent family history. No Known Allergies Current Outpatient Medications  Medication Sig Dispense Refill   doxycycline (VIBRAMYCIN) 100 MG capsule Take 1 capsule (100 mg total) by mouth 2 (two) times daily. 20 capsule 0   metFORMIN (GLUCOPHAGE) 500 MG tablet Take 1 tablet (500 mg total) by mouth 2 (two) times daily with a meal. (Patient not taking: Reported on 03/01/2021) 60 tablet 2   metFORMIN (GLUCOPHAGE) 500 MG tablet Take 1 tablet (500 mg total) by mouth 2 (two) times daily with a meal. (Patient not taking: Reported on 03/01/2021) 60 tablet 11  traMADol (ULTRAM) 50 MG tablet Take 50 mg by mouth 3 (three) times daily as needed for pain. (Patient not taking: No sig reported)     No current facility-administered medications for this visit.   No results found.  Review of Systems:   A ROS was performed including pertinent positives and negatives as documented in the HPI.  Physical Exam :   Constitutional: NAD and appears stated age Neurological: Alert and oriented Psych: Appropriate affect and cooperative There were no vitals taken for this visit.   Comprehensive Musculoskeletal Exam:    Left elbow is mildly swollen and small 2mm wound is clean, dry, and scabbed over.  0-120 degrees active range of motion.  Unable to express any fluid from the wound.  Left knee has visual varus  angulation with moderate effusion palpable.  Active range of motion from 10 to 100 degrees with crepitus.  Medial joint line tenderness.     Imaging:    Xray Review from 03/08/23 (Left knee 4 views) Significant tricompartmental osteoarthritis that is severe in the medial and patellofemoral compartments. Moderate joint effusion.   I personally reviewed and interpreted the radiographs.   Assessment:   77 y.o. male with left olecranon bursitis and severe left knee osteoarthritis.  His elbow does look much better regarding the amount of swelling and erythema.  There are no signs of infection and at this point I don't think incision and drainage will be necessary.  Discussed to continue keeping the area clean and monitoring for any increases in swelling or redness as well as systemic symptoms.  In regards to the left knee, he does have very significant tricompartmental osteoarthritis which appears severe particularly in the medial and patellofemoral compartments.  After discussing treatment options, patient would like to have the knee aspirated and injected with cortisone today, however he does understand that he may be a candidate for a total knee replacement.  The procedure was performed today and I was able to aspirate 42mL of blood-tinged serous fluid and injected with kenalog and lidocaine. Would like for patient to return to clinic should he notice any worsening of the elbow symptoms and recommend following up with Dr. Magnus Ivan for further discussion of knee treatment.   Plan :    -Follow up with Dr Magnus Ivan concerning left knee osteoarthritis -Return to clinic as needed     Procedure Note  Patient: Nathaniel Parks             Date of Birth: 07/19/1946           MRN: [id number]             Visit Date: 03/29/2023  Procedures: Visit Diagnoses:  1. Unilateral primary osteoarthritis, left knee   2. Olecranon bursitis, left elbow     Large Joint Inj: L knee on 03/29/2023 2:50  PM Indications: pain and joint swelling Details: 18 G 1.5 in needle, superolateral approach Medications: 4 mL lidocaine 1 %; 2 mL triamcinolone acetonide 40 MG/ML Aspirate: 42 mL blood-tinged and serous Outcome: tolerated well, no immediate complications Consent was given by the patient. Patient was prepped and draped in the usual sterile fashion.       I personally saw and evaluated the patient, and participated in the management and treatment plan.  Hazle Nordmann, PA-C Orthopedics  This document was dictated using Conservation officer, historic buildings. A reasonable attempt at proof reading has been made to minimize errors.

## 2023-04-19 ENCOUNTER — Ambulatory Visit (INDEPENDENT_AMBULATORY_CARE_PROVIDER_SITE_OTHER): Payer: Self-pay | Admitting: Physician Assistant

## 2023-04-19 ENCOUNTER — Encounter: Payer: Self-pay | Admitting: Physician Assistant

## 2023-04-19 DIAGNOSIS — M1712 Unilateral primary osteoarthritis, left knee: Secondary | ICD-10-CM

## 2023-04-19 NOTE — Progress Notes (Signed)
HPI: Patient 77 year old Spanish-speaking male comes in today with an interpreter and family member for evaluation of his left knee arthritis/pain.  He was seen by Tina Griffiths, PA-C at [facility] and was given a cortisone injection for his knee pain on 03/29/2023.  He states that he did gain some relief and is able to walk better since having the injection.  However he continues to have 8-9 out of 10 pain with ambulation.  He has no pain in the knee whatsoever when sedentary.  Notes that the pain is globally about the knee when ambulating.  He has had no injury no trauma to the knee.  Cold air does affect knee pain.  Reviewed the radiographs of his left knee.  These show tricompartmental arthritis with bone-on-bone medial compartment and severe patellofemoral arthritic changes.  Also varus deformity of the knee.  No acute fractures or acute findings otherwise.  Reports that he is not diabetic that he was placed on metformin in the past because his glucose levels were checked when he was not n.p.o..  However he has not seen a primary care provider since 2022.  He reports he is not not on any medications.  Recently seen for left elbow olecranon bursitis which is resolving.  Review of systems: Denies any chest pain, fevers chills, shortness of breath.  Denies any ongoing infections.  Physical exam: General well-developed well-nourished male no acute distress.  Does use a cane to ambulate. Psych: Alert and oriented Left knee crepitus with passive range of motion.  Maximal tenderness over the medial joint line and over the pes anserinus region.  There is no abnormal warmth erythema or effusion.  Obvious varus deformity.  No gross instability valgus varus stressing.  Impression: Left knee osteoarthritis  Plan: Discussed treatment which conservatively would be cortisone injections every 3 months.  I do not feel that he would benefit from viscosupplementation given the severe arthritis.  Other option would be  surgical interventions.  Total knee arthroplasty.  Discussed postoperative protocol with the patient and his family member.  Also discussed surgical procedure.  Risk benefits discussed.  They would like to find out about any financial assistance to have this procedure performed.  Due to the fact that he has not seen medical provider as far as her primary care provider in the last 2 years will have a CT his PCP for preoperative clearance.  Once he has this we will have him follow-up with Dr. Magnus Ivan.  Questions were encouraged and answered at length using interpreter.  We will look into any financial assistance for him and the actual cost of the procedure.  As he is self pay.

## 2023-05-10 ENCOUNTER — Encounter (HOSPITAL_BASED_OUTPATIENT_CLINIC_OR_DEPARTMENT_OTHER): Payer: Self-pay | Admitting: Physician Assistant

## 2023-05-10 ENCOUNTER — Ambulatory Visit (INDEPENDENT_AMBULATORY_CARE_PROVIDER_SITE_OTHER): Payer: Self-pay | Admitting: Physician Assistant

## 2023-05-10 DIAGNOSIS — M7022 Olecranon bursitis, left elbow: Secondary | ICD-10-CM

## 2023-05-10 MED ORDER — DOXYCYCLINE HYCLATE 100 MG PO TABS: 100.0000 mg | ORAL_TABLET | Freq: Two times a day (BID) | ORAL | 0 refills | Status: DC

## 2023-05-10 NOTE — Progress Notes (Signed)
Office Visit Note   Patient: Nathaniel Parks           Date of Birth: December 05, 1945           MRN: [id number] Visit Date: 05/10/2023              Requested by: Kallie Locks, FNP [id number] [place],  [place] [address] PCP: Kallie Locks, FNP  Chief Complaint  Patient presents with   Left Elbow - Pain      HPI: Patient is a 77 year old gentleman who follows up today for return of drainage from his left elbow.  He is accompanied by his granddaughter who is interpreting for him.  In May he presented to the emergency room with drainage from his left elbow.  He was not septic was not admitted was placed on antibiotics.  He was referred to Dr. Steward Drone.  He was seen by myself still had some drainage.  Was referred and seen by Jean Rosenthal twice.  He did have some resolution of his symptoms and was last seen about 3-1/2 weeks ago.  His granddaughter reports that soon after he stopped the antibiotics the drainage returned.  He does have a history of an A1c being as high as 7.7 a couple years ago went down to 5.5.  He has not seen a primary care provider since.  He is also 2 years status post irrigation and debridement of a infected prepatellar bursa.  He admits he is not taking any medication.  Denies any fever chills or malaise  Assessment & Plan: Visit Diagnoses:  1. Olecranon bursitis, left elbow     Plan: I had a discussion with his granddaughter who interpreted.  Given the recurrence of this drainage we recommend irrigation and debridement of the olecranon bursa on the left elbow.  Discussed the risks of the surgery including bleeding infection anesthesia complications need for future procedures.  We will place him on doxycycline until next week when he will see Dr. Steward Drone for surgical planning.  In the meantime we will get an A1c to see his diabetic control.  Also have given him paperwork and information for any financial assistance they may need.  He does need to follow-up for  primary care at [facility] or he can return to [facility] who has seen him in the past.  He was given strict return precautions if he develops any fever chills malaise or purulent drainage from his elbow or increased pain  Follow-Up Instructions: 1 week  Ortho Exam  Patient is alert, oriented, no adenopathy, well-dressed, normal affect, normal respiratory effort. Examination of his left arm he has no cellulitis he has a strong radial pulse good grip strength has extension and flexion of his elbow he does have a pinpoint area of drainage of nonpurulent nonodorous clear yellow fluid.  He does have some bogginess in his olecranon bursa but no cellulitis or erythema  Imaging: No results found. No images are attached to the encounter.  Labs: Lab Results  Component Value Date   HGBA1C 5.6 03/01/2021   HGBA1C 5.5 08/31/2020   HGBA1C 5.5 08/31/2020   HGBA1C 5.5 (A) 08/31/2020   HGBA1C 5.5 08/31/2020   ESRSEDRATE 71 (H) 05/20/2020   ESRSEDRATE 66 (H) 05/12/2020   CRP 3.9 (H) 05/20/2020   REPTSTATUS 03/11/2023 FINAL 03/08/2023   GRAMSTAIN  03/08/2023    ABUNDANT WBC PRESENT, PREDOMINANTLY PMN NO ORGANISMS SEEN    CULT  03/08/2023  NO GROWTH 3 DAYS Performed at [facility], [address], [place], [place] [address]    [facility] STAPHYLOCOCCUS AUREUS 05/12/2020     Lab Results  Component Value Date   ALBUMIN 3.6 03/08/2023   ALBUMIN 4.0 03/01/2021   ALBUMIN 4.3 08/31/2020    No results found for: "MG" Lab Results  Component Value Date   VD25OH 31.1 08/31/2020    No results found for: "PREALBUMIN"    Latest Ref Rng & Units 03/08/2023    1:01 PM 08/31/2020    3:06 PM 05/22/2020   10:40 AM  CBC EXTENDED  WBC 4.0 - 10.5 K/uL 6.6  5.3  4.9   RBC 4.22 - 5.81 MIL/uL 4.34  4.67  3.06   Hemoglobin 13.0 - 17.0 g/dL 44.0  10.2  9.4   HCT 72.5 - 52.0 % 40.5  39.6  29.2   Platelets 150 - 400 K/uL 240  302  336   NEUT# 1.7 - 7.7 K/uL 4.9  2.7     Lymph# 0.7 - 4.0 K/uL 1.1  2.0       There is no height or weight on file to calculate BMI.  Orders:  Orders Placed This Encounter  Procedures   HgB A1c   Meds ordered this encounter  Medications   doxycycline (VIBRA-TABS) 100 MG tablet    Sig: Take 1 tablet (100 mg total) by mouth 2 (two) times daily.    Dispense:  20 tablet    Refill:  0     Procedures: No procedures performed  Clinical Data: No additional findings.  ROS:  All other systems negative, except as noted in the HPI. Review of Systems  Objective: Vital Signs: There were no vitals taken for this visit.  Specialty Comments:  No specialty comments available.  PMFS History: Patient Active Problem List   Diagnosis Date Noted   Olecranon bursitis, left elbow 03/14/2023   Septic prepatellar bursitis of right knee 05/18/2020   Past Medical History:  Diagnosis Date   Pre-diabetes     No family history on file.  Past Surgical History:  Procedure Laterality Date   I & D EXTREMITY Right 05/21/2020   Procedure: IRRIGATION AND DEBRIDEMENT EXTREMITY;  Surgeon: Bjorn Pippin, MD;  Location: [facility];  Service: Orthopedics;  Laterality: Right;   IRRIGATION AND DEBRIDEMENT KNEE Right 05/19/2020   Procedure: IRRIGATION AND DEBRIDEMENT KNEE;  Surgeon: Teryl Lucy, MD;  Location: [facility];  Service: Orthopedics;  Laterality: Right;   KNEE ARTHROSCOPY Right    KNEE BURSECTOMY Right 05/19/2020   Procedure: KNEE BURSECTOMY;  Surgeon: Teryl Lucy, MD;  Location: [facility];  Service: Orthopedics;  Laterality: Right;   KNEE BURSECTOMY Right 05/21/2020   Procedure: KNEE BURSECTOMY;  Surgeon: Bjorn Pippin, MD;  Location: [facility];  Service: Orthopedics;  Laterality: Right;   Social History   Occupational History   Not on file  Tobacco Use   Smoking status: Never   Smokeless tobacco: Never  Vaping Use   Vaping status: Never Used  Substance and Sexual Activity   Alcohol use: Not Currently   Drug use: Never   Sexual  activity: Not on file

## 2023-05-18 ENCOUNTER — Ambulatory Visit (INDEPENDENT_AMBULATORY_CARE_PROVIDER_SITE_OTHER): Payer: Self-pay | Admitting: Orthopaedic Surgery

## 2023-05-18 ENCOUNTER — Ambulatory Visit (HOSPITAL_BASED_OUTPATIENT_CLINIC_OR_DEPARTMENT_OTHER): Payer: Self-pay | Admitting: Orthopaedic Surgery

## 2023-05-18 ENCOUNTER — Other Ambulatory Visit (HOSPITAL_BASED_OUTPATIENT_CLINIC_OR_DEPARTMENT_OTHER): Payer: Self-pay

## 2023-05-18 ENCOUNTER — Encounter (HOSPITAL_BASED_OUTPATIENT_CLINIC_OR_DEPARTMENT_OTHER): Payer: Self-pay | Admitting: Orthopaedic Surgery

## 2023-05-18 DIAGNOSIS — M1712 Unilateral primary osteoarthritis, left knee: Secondary | ICD-10-CM

## 2023-05-18 DIAGNOSIS — M7022 Olecranon bursitis, left elbow: Secondary | ICD-10-CM

## 2023-05-18 MED ORDER — IBUPROFEN 800 MG PO TABS
800.0000 mg | ORAL_TABLET | Freq: Three times a day (TID) | ORAL | 0 refills | Status: AC
  Filled 2023-05-18: qty 30, 10d supply, fill #0

## 2023-05-18 MED ORDER — ACETAMINOPHEN 500 MG PO TABS
500.0000 mg | ORAL_TABLET | Freq: Three times a day (TID) | ORAL | 0 refills | Status: AC
  Filled 2023-05-18: qty 30, 10d supply, fill #0

## 2023-05-18 NOTE — Progress Notes (Signed)
Chief Complaint: Left olecranon bursitis     History of Present Illness:    Nathaniel Parks is a 77 y.o. male right-hand-dominant male presents with multiple recurrent bouts of left olecranon bursitis.  He did see Hazle Nordmann 2 months prior for draining olecranon bursitis and was started on antibiotics.  At that time his wound did not improve although he is now presented 1 week again with residual draining.  He was placed on antibiotics by [place] [facility].  He is here today for further follow-up of the left elbow.    Surgical History:   None  PMH/PSH/Family History/Social History/Meds/Allergies:    Past Medical History:  Diagnosis Date   Pre-diabetes    Past Surgical History:  Procedure Laterality Date   I & D EXTREMITY Right 05/21/2020   Procedure: IRRIGATION AND DEBRIDEMENT EXTREMITY;  Surgeon: Bjorn Pippin, MD;  [facility];  Service: Orthopedics;  Laterality: Right;   IRRIGATION AND DEBRIDEMENT KNEE Right 05/19/2020   Procedure: IRRIGATION AND DEBRIDEMENT KNEE;  Surgeon: Teryl Lucy, MD;  Location: [facility];  Service: Orthopedics;  Laterality: Right;   KNEE ARTHROSCOPY Right    KNEE BURSECTOMY Right 05/19/2020   Procedure: KNEE BURSECTOMY;  Surgeon: Teryl Lucy, MD;  [facility];  Service: Orthopedics;  Laterality: Right;   KNEE BURSECTOMY Right 05/21/2020   Procedure: KNEE BURSECTOMY;  Surgeon: Bjorn Pippin, MD;  Location[facility];  Service: Orthopedics;  Laterality: Right;   Social History   Socioeconomic History   Marital status: Married    Spouse name: Not on file   Number of children: Not on file   Years of education: Not on file   Highest education level: Not on file  Occupational History   Not on file  Tobacco Use   Smoking status: Never   Smokeless tobacco: Never  Vaping Use   Vaping status: Never Used  Substance and Sexual Activity   Alcohol use: Not Currently   Drug use: Never   Sexual activity: Not on  file  Other Topics Concern   Not on file  Social History Narrative   Not on file   Social Determinants of Health   Financial Resource Strain: Not on file  Food Insecurity: Not on file  Transportation Needs: Not on file  Physical Activity: Not on file  Stress: Not on file  Social Connections: Not on file   History reviewed. No pertinent family history. No Known Allergies Current Outpatient Medications  Medication Sig Dispense Refill   acetaminophen (TYLENOL) 500 MG tablet Take 1 tablet (500 mg total) by mouth every 8 (eight) hours for 10 days. 30 tablet 0   ibuprofen (ADVIL) 800 MG tablet Take 1 tablet (800 mg total) by mouth every 8 (eight) hours for 10 days. Please take with food, please alternate with acetaminophen 30 tablet 0   doxycycline (VIBRA-TABS) 100 MG tablet Take 1 tablet (100 mg total) by mouth 2 (two) times daily. 20 tablet 0   doxycycline (VIBRAMYCIN) 100 MG capsule Take 1 capsule (100 mg total) by mouth 2 (two) times daily. 20 capsule 0   No current facility-administered medications for this visit.   No results found.  Review of Systems:   A ROS was performed including pertinent positives and negatives as documented in the HPI.  Physical Exam :  Constitutional: NAD and appears stated age Neurological: Alert and oriented Psych: Appropriate affect and cooperative There were no vitals taken for this visit.   Comprehensive Musculoskeletal Exam:    Left elbow with a boggy posterior bursa.  There is a draining sinus.  Elbow range of motion is full with full pro supination and no pain.  Distal neurosensory exam is intact.  Imaging:   Xray (3 views left elbow): Significant bursal swelling   I personally reviewed and interpreted the radiographs.   Assessment:   77 y.o. male right-hand-dominant with left elbow olecranon bursitis.  I have described now that unfortunately this is the second instance of it draining.  Unfortunately does appear to have a recurrent  sinus tract for which I have described that this would likely be persistent in terms of drainage and swelling.  Given the fact that he is now failed 2 rounds of antibiotics I do believe that he would be a candidate for left elbow bursectomy.  I did discuss this can be complicated by wound healing issues postoperatively.  That being said he would like to proceed with this.  Plan :    -Plan for left elbow olecranon bursectomy   After a lengthy discussion of treatment options, including risks, benefits, alternatives, complications of surgical and nonsurgical conservative options, the patient elected surgical repair.   The patient  is aware of the material risks  and complications including, but not limited to injury to adjacent structures, neurovascular injury, infection, numbness, bleeding, implant failure, thermal burns, stiffness, persistent pain, failure to heal, disease transmission from allograft, need for further surgery, dislocation, anesthetic risks, blood clots, risks of death,and others. The probabilities of surgical success and failure discussed with patient given their particular co-morbidities.The time and nature of expected rehabilitation and recovery was discussed.The patient's questions were all answered preoperatively.  No barriers to understanding were noted. I explained the natural history of the disease process and Rx rationale.  I explained to the patient what I considered to be reasonable expectations given their personal situation.  The final treatment plan was arrived at through a shared patient decision making process model.      I personally saw and evaluated the patient, and participated in the management and treatment plan.  Huel Cote, MD Attending Physician, Orthopedic Surgery  This document was dictated using Dragon voice recognition software. A reasonable attempt at proof reading has been made to minimize errors.

## 2023-08-15 ENCOUNTER — Telehealth: Payer: Self-pay | Admitting: Orthopaedic Surgery

## 2023-08-15 NOTE — Telephone Encounter (Signed)
Pt granddaughter called in wondering why they have not heard anything about scheduling surgery

## 2023-08-18 NOTE — Telephone Encounter (Signed)
I spoke with the granddaughter 08/17/23. Advised multiple messages were left for the patient thru the interpreter and myself requesting a call back to discuss the need for GP clearance for surgery. Per granddaughter patient does not have a GP. According to patient records, patient was given rx for Diabetic medication back in 2022 with no hx of follow up. Patient needs GP consult/ clearance before surgery can be schedule. Information for Primary care at [facility] was given to the granddaughter to make patient an appointment to establish care, and get surgical clearance. I called [place] [facility] and advised  granddaughter was calling to schedule an appointment and clearance is needed on patient. Clearance form was faxed to GP, and confirmation was received.

## 2023-08-31 ENCOUNTER — Ambulatory Visit (INDEPENDENT_AMBULATORY_CARE_PROVIDER_SITE_OTHER): Payer: Self-pay | Admitting: Family Medicine

## 2023-08-31 ENCOUNTER — Encounter (HOSPITAL_BASED_OUTPATIENT_CLINIC_OR_DEPARTMENT_OTHER): Payer: Self-pay | Admitting: Family Medicine

## 2023-08-31 VITALS — BP 151/90 | HR 70 | Ht 63.0 in | Wt 161.5 lb

## 2023-08-31 DIAGNOSIS — E1165 Type 2 diabetes mellitus with hyperglycemia: Secondary | ICD-10-CM | POA: Insufficient documentation

## 2023-08-31 DIAGNOSIS — I10 Essential (primary) hypertension: Secondary | ICD-10-CM

## 2023-08-31 DIAGNOSIS — R7303 Prediabetes: Secondary | ICD-10-CM

## 2023-08-31 DIAGNOSIS — Z01818 Encounter for other preprocedural examination: Secondary | ICD-10-CM | POA: Insufficient documentation

## 2023-08-31 MED ORDER — LOSARTAN POTASSIUM 25 MG PO TABS: 12.5000 mg | ORAL_TABLET | Freq: Every day | ORAL | 3 refills | Status: DC

## 2023-08-31 NOTE — Patient Instructions (Signed)
Monitor your Blood pressure with an automatic arm cuff at least 2-3x a week.   Goal Blood pressure: Top Number 130-140 , Bottom Number 70-80.

## 2023-08-31 NOTE — Progress Notes (Signed)
New Patient Office Visit  Subjective:   Nathaniel Parks 05/29/1946 08/31/2023  Chief Complaint  Patient presents with   New Patient (Initial Visit)    New patient pt is supposed to have knee surgery pt needs surgery clearance for downstairs ortho     HPI: Nathaniel Parks presents today to establish care at Primary Care and Sports Medicine at [facility]. Introduced to Publishing rights manager role and practice setting.  All questions answered.   Last PCP: Thad Ranger NP  Last annual physical: 2022 Concerns: See below   [place] is requesting surgical clearance to be completed for possible left total knee arthroplasty. Pt had not seen a PCP in approx. 2 years per chart review. He has a hx of prediabetes and was previously taking Metformin. Per chart review, his blood pressure has been elevating for several visits. He has not been diagnosed with HTN previously and is not taking any medication currently.    1) High Risk Cardiac Conditions:  1) Recent MI - No.  2) Decompensated Heart Failure - No.  3) Unstable angina - No.  4) Symptomatic arrythmia - No.  5) Sx Valvular Disease - No.  2) Intermediate Risk Factors: DM, CKD, CVA, CHF, CAD - Yes.   Prediabetes, Essential Hypertension  2) Functional Status: > 4 mets (Walk, run, climb stairs) Yes.  ,greater than 4 but less than 10.Nathaniel Parks Activity Status Index: 15.45 points    3) Surgery Specific Risk: Intermediate (Carotid, Head and Neck, Orthopaedic )           4) Further Noninvasive evaluation:   1) EKG - Yes.   Will complete due to prediabetes risk and new dx of HTN.    Hx of MI, CVA, CAD, DM, CKD  2) Echo - No.   Worsening dyspnea or CHF without an echo in the past year  3) Stress Testing - Active Cardiac Disease - No.  4) CXR No.   If asymptomatic, healthy, no respiratory symptoms it is not needed  5)PFTs No.   OSA, OHS, or significant cardiopulmonary history  5) Need for medical therapy - Beta Blocker,  Statins indicated ? Yes.  , will start on ARB for BP control today.   IMPAIRED FASTING GLUCOSE Nathaniel Parks is here for medical management of impaired fasting glucose.  Patient's current IFG medication regimen is: Diet and light exercise Adhering to a diabetic diet: Yes Exercising Regularly: Yes, lifts weights and walks frequently Checking Blood Sugars: No Denies polydipsia, polyphagia, polyuria.   Lab Results  Component Value Date   HGBA1C 5.6 03/01/2021    HYPERTENSION: Nathaniel Parks presents for the medical management of hypertension.  Patient's current hypertension medication regimen is: None Patient is not currently taking prescribed medications for HTN.  Patient is not regularly keeping a check on BP at home.  Adhering to low sodium diet: Yes Exercising Regularly: Yes, light exercise  Denies headache, dizziness, CP, SHOB, vision changes.   BP Readings from Last 3 Encounters:  08/31/23 (!) 151/90  03/08/23 124/78  03/01/21 (!) 140/57       The following portions of the patient's history were reviewed and updated as appropriate: past medical history, past surgical history, family history, social history, allergies, medications, and problem list.   Patient Active Problem List   Diagnosis Date Noted   Pre-operative clearance 08/31/2023   Primary hypertension 08/31/2023   Prediabetes    Olecranon bursitis, left elbow 03/14/2023   Septic prepatellar bursitis of  right knee 05/18/2020   Past Medical History:  Diagnosis Date   BPH (benign prostatic hyperplasia)    Elevated BP without diagnosis of hypertension    Pre-diabetes    Past Surgical History:  Procedure Laterality Date   I & D EXTREMITY Right 05/21/2020   Procedure: IRRIGATION AND DEBRIDEMENT EXTREMITY;  Surgeon: Bjorn Pippin, MD;  [facility];  Service: Orthopedics;  Laterality: Right;   IRRIGATION AND DEBRIDEMENT KNEE Right 05/19/2020   Procedure: IRRIGATION AND DEBRIDEMENT KNEE;  Surgeon: Teryl Lucy, MD;  [facility];  Service: Orthopedics;  Laterality: Right;   KNEE ARTHROSCOPY Right    KNEE BURSECTOMY Right 05/19/2020   Procedure: KNEE BURSECTOMY;  Surgeon: Teryl Lucy, MD;  [facility];  Service: Orthopedics;  Laterality: Right;   KNEE BURSECTOMY Right 05/21/2020   Procedure: KNEE BURSECTOMY;  Surgeon: Bjorn Pippin, MD;  [facility];  Service: Orthopedics;  Laterality: Right;   History reviewed. No pertinent family history. Social History   Socioeconomic History   Marital status: Married    Spouse name: Not on file   Number of children: Not on file   Years of education: Not on file   Highest education level: Not on file  Occupational History   Not on file  Tobacco Use   Smoking status: Never    Passive exposure: Never   Smokeless tobacco: Never  Vaping Use   Vaping status: Never Used  Substance and Sexual Activity   Alcohol use: Not Currently   Drug use: Never   Sexual activity: Not on file  Other Topics Concern   Not on file  Social History Narrative   Not on file   Social Determinants of Health   Financial Resource Strain: Not on file  Food Insecurity: Not on file  Transportation Needs: Not on file  Physical Activity: Not on file  Stress: Not on file  Social Connections: Not on file  Intimate Partner Violence: Not on file   Outpatient Medications Prior to Visit  Medication Sig Dispense Refill   doxycycline (VIBRA-TABS) 100 MG tablet Take 1 tablet (100 mg total) by mouth 2 (two) times daily. (Patient not taking: Reported on 08/31/2023) 20 tablet 0   doxycycline (VIBRAMYCIN) 100 MG capsule Take 1 capsule (100 mg total) by mouth 2 (two) times daily. (Patient not taking: Reported on 08/31/2023) 20 capsule 0   No facility-administered medications prior to visit.   No Known Allergies  ROS: A complete ROS was performed with pertinent positives/negatives noted in the HPI. The remainder of the ROS are negative.   Objective:   Today's Vitals    08/31/23 0956 08/31/23 1015  BP: (!) 159/80 (!) 151/90  Pulse: 70   SpO2: 98%   Weight: 161 lb 8 oz (73.3 kg)   Height: 5\' 3"  (1.6 m)   PainLoc: Knee     GENERAL: Well-appearing, in NAD. Well nourished.  SKIN: Pink, warm and dry. No rash, lesion, ulceration, or ecchymoses.  Head: Normocephalic. NECK: Trachea midline. Full ROM w/o pain or tenderness.  RESPIRATORY: Chest wall symmetrical. Respirations even and non-labored. Breath sounds clear to auscultation bilaterally.  CARDIAC: S1, S2 present, regular rate and rhythm without murmur or gallops. Peripheral pulses 2+ bilaterally. Carotid arteries without bruit or thrill.  MSK: Muscle tone and strength appropriate for age. EXTREMITIES: Without clubbing, cyanosis, or edema.  NEUROLOGIC: No motor or sensory deficits. Steady, even gait. C2-C12 intact.  PSYCH/MENTAL STATUS: Alert, oriented x 3. Cooperative, appropriate mood and affect.  EKG: normal EKG, normal sinus rhythm, there are no previous tracings available for comparison.     Assessment & Plan:  1. Primary hypertension EKG unremarkable. Will obtain labs today to evaluate electrolyte abnormalities, anemias, rule out thyroid disorders. Recommend pt to obtain BP arm cuff and check BP 2-3x per week. Start Losartan 12.5mg  pending renal function. Follow up in 4 weeks for BP check and titration. Discussed ways to lower BP naturally as well.   - EKG 12-Lead - TSH - Comprehensive metabolic panel - CBC with Differential/Platelet  2. Prediabetes Will obtain LP and A1C with labs today to evaluate glucose control.  - Lipid panel - Hemoglobin A1c  3. Pre-operative clearance Surgical clearance form requested from [facility] by PCP today. Pt is cleared from primary care standpoint with improved control of BP and pending A1C with glucose control evaluation. Pt agreeable and labs obtained.   I have independently evaluated patient.  Linn Cinque Begley is a 77 y.o. male who is moderate risk for  a intermediate risk surgery.  There are modifiable risk factors including evaluating control of impaired fasting glucose and hypertension control of blood pressure. Travontae Freiberger Wilner's RCRI/NSQIP calculation for MACE is: 0 points.     Patient to reach out to office if new, worrisome, or unresolved symptoms arise or if no improvement in patient's condition. Patient verbalized understanding and is agreeable to treatment plan. All questions answered to patient's satisfaction.    Return in about 4 weeks (around 09/28/2023) for HYPERTENSION.    Yolanda Manges, FNP

## 2023-09-01 ENCOUNTER — Telehealth: Payer: Self-pay | Admitting: Orthopaedic Surgery

## 2023-09-01 ENCOUNTER — Other Ambulatory Visit (HOSPITAL_BASED_OUTPATIENT_CLINIC_OR_DEPARTMENT_OTHER): Payer: Self-pay | Admitting: Family Medicine

## 2023-09-01 DIAGNOSIS — R7989 Other specified abnormal findings of blood chemistry: Secondary | ICD-10-CM

## 2023-09-01 LAB — COMPREHENSIVE METABOLIC PANEL
ALT: 44 [IU]/L (ref 0–44)
AST: 29 [IU]/L (ref 0–40)
Albumin: 4 g/dL (ref 3.8–4.8)
Alkaline Phosphatase: 98 [IU]/L (ref 44–121)
BUN/Creatinine Ratio: 19 (ref 10–24)
BUN: 13 mg/dL (ref 8–27)
Bilirubin Total: 0.5 mg/dL (ref 0.0–1.2)
CO2: 24 mmol/L (ref 20–29)
Calcium: 9.1 mg/dL (ref 8.6–10.2)
Chloride: 109 mmol/L — ABNORMAL HIGH (ref 96–106)
Creatinine, Ser: 0.7 mg/dL — ABNORMAL LOW (ref 0.76–1.27)
Globulin, Total: 1.8 g/dL (ref 1.5–4.5)
Glucose: 110 mg/dL — ABNORMAL HIGH (ref 70–99)
Potassium: 3.8 mmol/L (ref 3.5–5.2)
Sodium: 145 mmol/L — ABNORMAL HIGH (ref 134–144)
Total Protein: 5.8 g/dL — ABNORMAL LOW (ref 6.0–8.5)
eGFR: 95 mL/min/{1.73_m2} (ref >59)

## 2023-09-01 LAB — CBC WITH DIFFERENTIAL/PLATELET
Basophils Absolute: 0 10*3/uL (ref 0.0–0.2)
Basos: 0 %
EOS (ABSOLUTE): 0.1 10*3/uL (ref 0.0–0.4)
Eos: 1 %
Hematocrit: 39.5 % (ref 37.5–51.0)
Hemoglobin: 12.7 g/dL — ABNORMAL LOW (ref 13.0–17.7)
Immature Grans (Abs): 0 10*3/uL (ref 0.0–0.1)
Immature Granulocytes: 0 %
Lymphocytes Absolute: 1.3 10*3/uL (ref 0.7–3.1)
Lymphs: 28 %
MCH: 29.8 pg (ref 26.6–33.0)
MCHC: 32.2 g/dL (ref 31.5–35.7)
MCV: 93 fL (ref 79–97)
Monocytes Absolute: 0.4 10*3/uL (ref 0.1–0.9)
Monocytes: 9 %
Neutrophils Absolute: 2.8 10*3/uL (ref 1.4–7.0)
Neutrophils: 62 %
Platelets: 259 10*3/uL (ref 150–450)
RBC: 4.26 x10E6/uL (ref 4.14–5.80)
RDW: 14.6 % (ref 11.6–15.4)
WBC: 4.6 10*3/uL (ref 3.4–10.8)

## 2023-09-01 LAB — LIPID PANEL
Chol/HDL Ratio: 4.8 ratio (ref 0.0–5.0)
Cholesterol, Total: 209 mg/dL — ABNORMAL HIGH (ref 100–199)
HDL: 44 mg/dL (ref >39)
LDL Chol Calc (NIH): 146 mg/dL — ABNORMAL HIGH (ref 0–99)
Triglycerides: 103 mg/dL (ref 0–149)
VLDL Cholesterol Cal: 19 mg/dL (ref 5–40)

## 2023-09-01 LAB — HEMOGLOBIN A1C
Est. average glucose Bld gHb Est-mCnc: 171 mg/dL
Hgb A1c MFr Bld: 7.6 % — ABNORMAL HIGH (ref 4.8–5.6)

## 2023-09-01 LAB — TSH: TSH: 5.07 u[IU]/mL — ABNORMAL HIGH (ref 0.450–4.500)

## 2023-09-01 NOTE — Telephone Encounter (Signed)
Patient was seen with Jerre Simon, FNP on 08/31/23 to establish with a primary care and to be cleared for elbow surgery. Per patient, elbow is no longer bothering him. The hole has now closed. He would like to proceed with knee surgery instead. GP messaged, requesting a new clearance form for knee surgery. Is doctor willing to proceed with surgery on patient's knee? Please advise.

## 2023-09-04 LAB — SPECIMEN STATUS REPORT

## 2023-09-04 LAB — T3, FREE: T3, Free: 2.7 pg/mL (ref 2.0–4.4)

## 2023-09-04 LAB — T4, FREE: Free T4: 1.37 ng/dL (ref 0.82–1.77)

## 2023-09-04 NOTE — Progress Notes (Signed)
Please let patient know that his cholesterol is significantly elevated.  Looking at his cholesterol over several years it has continued to increase.  At this time I would also recommend starting him on a statin medication to lower his risk of cardiovascular disease, heart attack and stroke.  If he is agreeable let me know.  Thyroid was slightly elevated but his T3 and T4 were normal.  His BMP renal function was slightly low, please have him increase his clear fluids.  His A1c is in the diabetic category and is uncontrolled currently.  It looks like he has been in the range of 7 approximately 3 years ago as well.  We would recommend either significant dietary and exercise changes or starting him on metformin at this time.  Please let me know if he is agreeable to starting both metformin and or the statin.  Additionally, he was been scheduled for a 62-month follow-up and not a 4-week follow-up for hypertension.

## 2023-09-07 NOTE — Telephone Encounter (Signed)
I spoke with the grand-daughter and advised her patient needs to be seen for his knee before surgery can be scheduled. An appointment was made for 09/20/23 for evaluation of patient's left knee.

## 2023-09-08 ENCOUNTER — Telehealth (HOSPITAL_BASED_OUTPATIENT_CLINIC_OR_DEPARTMENT_OTHER): Payer: Self-pay | Admitting: Family Medicine

## 2023-09-08 ENCOUNTER — Encounter (HOSPITAL_BASED_OUTPATIENT_CLINIC_OR_DEPARTMENT_OTHER): Payer: Self-pay | Admitting: Family Medicine

## 2023-09-08 NOTE — Telephone Encounter (Signed)
[name], GRAND DAUGHTER CALLING IN LOSARTAN 25MG  TO BE CUT IN HALF HOWEVER THEY ARE TINY PILLS AND UNABLE TO CUT AND DOES NOT WANT TO TAKE TOO MUCH. CAN SMALLER DOSE BE CALLED IN THE 12.5MG ?

## 2023-09-11 ENCOUNTER — Encounter (HOSPITAL_BASED_OUTPATIENT_CLINIC_OR_DEPARTMENT_OTHER): Payer: Self-pay | Admitting: *Deleted

## 2023-09-20 ENCOUNTER — Ambulatory Visit (INDEPENDENT_AMBULATORY_CARE_PROVIDER_SITE_OTHER): Payer: Self-pay | Admitting: Orthopaedic Surgery

## 2023-09-20 DIAGNOSIS — M1712 Unilateral primary osteoarthritis, left knee: Secondary | ICD-10-CM

## 2023-09-20 NOTE — Progress Notes (Signed)
Chief Complaint: Left olecranon bursitis     History of Present Illness:   09/20/2023: Follow-up of his left knee.  He has been seen by Delane Ginger and Dr. Magnus Ivan in the past.  He has had injections in the past.  He is still having persistent knee pain with crepitus and essentially any range of motion.  He is only able to walk limited distances at this point.  Does have a history of diabetes with a recent A1c of 7.6  Barton Heshimu Kuramoto is a 77 y.o. male right-hand-dominant male presents with multiple recurrent bouts of left olecranon bursitis.  He did see Hazle Nordmann 2 months prior for draining olecranon bursitis and was started on antibiotics.  At that time his wound did not improve although he is now presented 1 week again with residual draining.  He was placed on antibiotics by [facility].  He is here today for further follow-up of the left elbow.    Surgical History:   None  PMH/PSH/Family History/Social History/Meds/Allergies:    Past Medical History:  Diagnosis Date   BPH (benign prostatic hyperplasia)    Elevated BP without diagnosis of hypertension    Pre-diabetes    Past Surgical History:  Procedure Laterality Date   I & D EXTREMITY Right 05/21/2020   Procedure: IRRIGATION AND DEBRIDEMENT EXTREMITY;  Surgeon: Bjorn Pippin, MD;  Location: [facility];  Service: Orthopedics;  Laterality: Right;   IRRIGATION AND DEBRIDEMENT KNEE Right 05/19/2020   Procedure: IRRIGATION AND DEBRIDEMENT KNEE;  Surgeon: Teryl Lucy, MD;  Location: [facility];  Service: Orthopedics;  Laterality: Right;   KNEE ARTHROSCOPY Right    KNEE BURSECTOMY Right 05/19/2020   Procedure: KNEE BURSECTOMY;  Surgeon: Teryl Lucy, MD;  Location: [facility];  Service: Orthopedics;  Laterality: Right;   KNEE BURSECTOMY Right 05/21/2020   Procedure: KNEE BURSECTOMY;  Surgeon: Bjorn Pippin, MD;  Location: [facility];  Service: Orthopedics;  Laterality: Right;   Social History    Socioeconomic History   Marital status: Married    Spouse name: Not on file   Number of children: Not on file   Years of education: Not on file   Highest education level: Not on file  Occupational History   Not on file  Tobacco Use   Smoking status: Never    Passive exposure: Never   Smokeless tobacco: Never  Vaping Use   Vaping status: Never Used  Substance and Sexual Activity   Alcohol use: Not Currently   Drug use: Never   Sexual activity: Not on file  Other Topics Concern   Not on file  Social History Narrative   Not on file   Social Determinants of Health   Financial Resource Strain: Not on file  Food Insecurity: Not on file  Transportation Needs: Not on file  Physical Activity: Not on file  Stress: Not on file  Social Connections: Not on file   No family history on file. No Known Allergies Current Outpatient Medications  Medication Sig Dispense Refill   losartan (COZAAR) 25 MG tablet Take 0.5 tablets (12.5 mg total) by mouth daily. 30 tablet 3   No current facility-administered medications for this visit.   No results found.  Review of Systems:   A ROS was performed including pertinent positives and negatives as documented in the  HPI.  Physical Exam :   Constitutional: NAD and appears stated age Neurological: Alert and oriented Psych: Appropriate affect and cooperative There were no vitals taken for this visit.   Comprehensive Musculoskeletal Exam:     Left knee with positive crepitus with range of motion from 0 to 100 degrees.  Tricompartmental sensitivity.  Negative Lachman.  Varus and valgus stress was not tested distal neurosensory exam is intact  Imaging:   Xray (4 views left knee): Severe left knee osteoarthritis  I personally reviewed and interpreted the radiographs.   Assessment:   77 y.o. 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  At this time he has had injections in the past without any relief.  Given this  I do ultimately believe that he may benefit from a total knee arthroplasty.  He has seen Delane Ginger and Dr. Magnus Ivan in the past.  Will plan for referral to Dr. Magnus Ivan for discussion of left knee arthroplasty  Plan :    -Plan for referral to Dr. Magnus Ivan for discussion of left knee arthroplasty   I personally saw and evaluated the patient, and participated in the management and treatment plan.  Huel Cote, MD Attending Physician, Orthopedic Surgery  This document was dictated using Dragon voice recognition software. A reasonable attempt at proof reading has been made to minimize errors.

## 2023-09-25 ENCOUNTER — Telehealth: Payer: Self-pay | Admitting: Orthopaedic Surgery

## 2023-09-25 NOTE — Telephone Encounter (Signed)
Patient's granddaughter called. Says the cholesterol medication wasn't called in. Would like [facility] to call that in for him.

## 2023-09-27 NOTE — Telephone Encounter (Signed)
Results from pt's labs:  Hilbert Bible, [place] 09/04/2023  1:09 PM EST     Please let patient know that his cholesterol is significantly elevated.  Looking at his cholesterol over several years it has continued to increase.  At this time I would also recommend starting him on a statin medication to lower his risk of cardiovascular disease, heart attack and stroke.  If he is agreeable let me know.  Thyroid was slightly elevated but his T3 and T4 were normal.  His BMP renal function was slightly low, please have him increase his clear fluids.  His A1c is in the diabetic category and is uncontrolled currently.  It looks like he has been in the range of 7 approximately 3 years ago as well.  We would recommend either significant dietary and exercise changes or starting him on metformin at this time.  Please let me know if he is agreeable to starting both metformin and or the statin.  Additionally, he was been scheduled for a 8-month follow-up and not a 4-week follow-up for hypertension.    Pt was attempted to be contacted multiple times via interpreter line and letter was also sent to pt.  Called and spoke with pt's granddaughter who stated pt is fine being started on medication for both cholesterol as well as diabetes.  Routing to [employer].  Pharmacy for meds [facility].

## 2023-09-28 ENCOUNTER — Other Ambulatory Visit (HOSPITAL_BASED_OUTPATIENT_CLINIC_OR_DEPARTMENT_OTHER): Payer: Self-pay | Admitting: Family Medicine

## 2023-09-28 MED ORDER — METFORMIN HCL ER 500 MG PO TB24: 500.0000 mg | ORAL_TABLET | Freq: Every day | ORAL | 3 refills | Status: DC

## 2023-09-28 MED ORDER — ATORVASTATIN CALCIUM 40 MG PO TABS
40.0000 mg | ORAL_TABLET | Freq: Every evening | ORAL | 3 refills | Status: DC
Start: 2023-09-28 — End: 2024-07-08

## 2023-10-11 ENCOUNTER — Ambulatory Visit (INDEPENDENT_AMBULATORY_CARE_PROVIDER_SITE_OTHER): Payer: Self-pay | Admitting: Orthopaedic Surgery

## 2023-10-11 ENCOUNTER — Encounter: Payer: Self-pay | Admitting: Orthopaedic Surgery

## 2023-10-11 DIAGNOSIS — M1712 Unilateral primary osteoarthritis, left knee: Secondary | ICD-10-CM | POA: Insufficient documentation

## 2023-10-11 NOTE — Progress Notes (Signed)
The patient is a 77 year old male that I am seeing for the first time.  However he is referred to me by my partner Dr. Steward Drone to evaluate and treat severe arthritis of his left knee.  He has been dealing with left knee pain for several years now that has been getting worse.  He has had aspirations and injections of that left knee.  X-rays on the canopy system for me to review.  He is a diabetic but his blood glucose is trending in the right direction and his last hemoglobin A1c was 7.6.  3 years ago was 5.5.  He does have high blood pressure and he is on medications for his high blood pressure and his diabetes.  He does not speak [place].  He denies smoking.  He does ambulate with a walking stick as well.  Examination of his left knee shows significant varus malalignment that is not correctable.  He has a large joint effusion.  There is pain throughout the arc of motion of his left knee and it feels stable ligamentously.  X-rays of the left knee shows significant varus malalignment.  There is severe bone-on-bone arthritis in all 3 compartments and osteophytes in all 3 compartments.  I did show him a knee replacement model.  We used an interpreter to describe in detail what knee replacement surgery involves.  We discussed the risk and benefits of the surgery and what to expect from an intraoperative and postoperative standpoint.  This was reviewed with the patient and his wife.  All questions and concerns were answered and addressed.  He is interested in having knee replacement surgery.  We will work on getting this scheduled sometime later in January 2025 versus February depending on the surgery schedule.  They understand this as well.

## 2023-11-01 ENCOUNTER — Other Ambulatory Visit: Payer: Self-pay

## 2023-11-17 ENCOUNTER — Telehealth: Payer: Self-pay

## 2023-11-17 NOTE — Telephone Encounter (Signed)
Called patient using [employer] to discuss scheduling surgery.  Voice mail was left for return call.

## 2023-12-13 ENCOUNTER — Ambulatory Visit (HOSPITAL_BASED_OUTPATIENT_CLINIC_OR_DEPARTMENT_OTHER): Payer: Self-pay | Admitting: Family Medicine

## 2023-12-14 ENCOUNTER — Ambulatory Visit (HOSPITAL_BASED_OUTPATIENT_CLINIC_OR_DEPARTMENT_OTHER): Payer: Self-pay | Admitting: Family Medicine

## 2023-12-19 ENCOUNTER — Encounter (HOSPITAL_BASED_OUTPATIENT_CLINIC_OR_DEPARTMENT_OTHER): Payer: Self-pay | Admitting: Family Medicine

## 2023-12-19 ENCOUNTER — Ambulatory Visit (HOSPITAL_BASED_OUTPATIENT_CLINIC_OR_DEPARTMENT_OTHER): Payer: Self-pay | Admitting: Family Medicine

## 2023-12-19 VITALS — BP 129/82 | HR 72 | Ht 63.0 in | Wt 155.6 lb

## 2023-12-19 DIAGNOSIS — R350 Frequency of micturition: Secondary | ICD-10-CM

## 2023-12-19 DIAGNOSIS — N401 Enlarged prostate with lower urinary tract symptoms: Secondary | ICD-10-CM

## 2023-12-19 LAB — POCT URINALYSIS DIP (CLINITEK)
Bilirubin, UA: NEGATIVE
Glucose, UA: NEGATIVE mg/dL
Leukocytes, UA: NEGATIVE
Nitrite, UA: NEGATIVE
POC PROTEIN,UA: 30 — AB
Spec Grav, UA: >=1.03 — AB (ref 1.010–1.025)
Urobilinogen, UA: 0.2 U/dL
pH, UA: 6 (ref 5.0–8.0)

## 2023-12-19 MED ORDER — TAMSULOSIN HCL 0.4 MG PO CAPS: 0.4000 mg | ORAL_CAPSULE | Freq: Every day | ORAL | 3 refills | Status: DC

## 2023-12-19 NOTE — Progress Notes (Signed)
 Subjective:   Nathaniel Parks 1945/11/06 12/19/2023  Chief Complaint  Patient presents with   bladder control issues    Patient had been given prostate medication. Has been having problems controlling his bladder and will begin to urinate on himself. States this has been happening off and on for about 2 years.    HPI: Nathaniel Parks presents today for re-assessment and management of chronic medical conditions.  BPH: Onset:  gradual Duration: chronic. Patient was diagnosed with BPH in 2022 and started on Flomax. He denies improvement with taking the medication but he is unsure when he stopped this medication.  He is currenlty taking an OTC Prostate supplement without relief.  He did not follow-up with PCP or urology after starting medication for BPH.  Last PSA in 2021 was normal at 1.9.  He reports increased urgency and frequency of urination.  Denies difficulty with starting stream or sensation of retained urine.  He does report increased urination with certain movements that places pressure on the bladder.   Nocturia: 4-5x per night Urinary frequency:yes Incomplete voiding: no Urgency:  yes, reports certain movements increase sense of urgency and must be near to bathroom due to frequency and sense of urgency.  Weak urinary stream: no Straining to start stream: no Dysuria: no  BPH status: uncontrolled  IPSS Questionnaire (AUA-7): Over the past month.   1)  How often have you had a sensation of not emptying your bladder completely after you finish urinating?  0 - Not at all  2)  How often have you had to urinate again less than two hours after you finished urinating? 5 - Almost always  3)  How often have you found you stopped and started again several times when you urinated?  1 - Less than 1 time in 5  4) How difficult have you found it to postpone urination?  5 - Almost always  5) How often have you had a weak urinary stream?  1 - Less than 1 time in 5  6) How often  have you had to push or strain to begin urination?  0 - Not at all  7) How many times did you most typically get up to urinate from the time you went to bed until the time you got up in the morning?  5 - 5+ times  Total score:  0-7 mildly symptomatic   8-19 moderately symptomatic   20-35 severely symptomatic      The following portions of the patient's history were reviewed and updated as appropriate: past medical history, past surgical history, family history, social history, allergies, medications, and problem list.   Patient Active Problem List   Diagnosis Date Noted   Unilateral primary osteoarthritis, left knee 10/11/2023   Pre-operative clearance 08/31/2023   Primary hypertension 08/31/2023   Prediabetes    Olecranon bursitis, left elbow 03/14/2023   Septic prepatellar bursitis of right knee 05/18/2020   Past Medical History:  Diagnosis Date   BPH (benign prostatic hyperplasia)    Elevated BP without diagnosis of hypertension    Pre-diabetes    Past Surgical History:  Procedure Laterality Date   I & D EXTREMITY Right 05/21/2020   Procedure: IRRIGATION AND DEBRIDEMENT EXTREMITY;  Surgeon: Bjorn Pippin, MD;  Location: [facility];  Service: Orthopedics;  Laterality: Right;   IRRIGATION AND DEBRIDEMENT KNEE Right 05/19/2020   Procedure: IRRIGATION AND DEBRIDEMENT KNEE;  Surgeon: Teryl Lucy, MD;  Location: [facility];  Service: Orthopedics;  Laterality:  Right;   KNEE ARTHROSCOPY Right    KNEE BURSECTOMY Right 05/19/2020   Procedure: KNEE BURSECTOMY;  Surgeon: Teryl Lucy, MD;  Location: [facility];  Service: Orthopedics;  Laterality: Right;   KNEE BURSECTOMY Right 05/21/2020   Procedure: KNEE BURSECTOMY;  Surgeon: Bjorn Pippin, MD;  Location: [facility];  Service: Orthopedics;  Laterality: Right;   History reviewed. No pertinent family history. Outpatient Medications Prior to Visit  Medication Sig Dispense Refill   Misc Natural Products (PROSTATE PO) Take 1 capsule by mouth daily.      Omega-3 Fatty Acids (OMEGA-3 FISH OIL PO) Take 1 capsule by mouth daily.     atorvastatin (LIPITOR) 40 MG tablet Take 1 tablet (40 mg total) by mouth at bedtime. 90 tablet 3   losartan (COZAAR) 25 MG tablet Take 0.5 tablets (12.5 mg total) by mouth daily. 30 tablet 3   metFORMIN (GLUCOPHAGE-XR) 500 MG 24 hr tablet Take 1 tablet (500 mg total) by mouth daily with breakfast. 90 tablet 3   No facility-administered medications prior to visit.   No Known Allergies   ROS: A complete ROS was performed with pertinent positives/negatives noted in the HPI. The remainder of the ROS are negative.    Objective:   Today's Vitals   12/19/23 1419  BP: 129/82  Pulse: 72  SpO2: 99%  Weight: 155 lb 9.6 oz (70.6 kg)  Height: 5\' 3"  (1.6 m)    Physical Exam          GENERAL: Well-appearing, in NAD. Well nourished.  SKIN: Pink, warm and dry.   Head: Normocephalic. NECK: Trachea midline. Full ROM w/o pain or tenderness.  RESPIRATORY: Chest wall symmetrical. Respirations even and non-labored.  GI: Abdomen soft, non-tender. Normoactive bowel sounds. No rebound tenderness. No hepatomegaly or splenomegaly. No CVA tenderness.  MSK: Muscle tone and strength appropriate for age. Joints w/o tenderness, redness, or swelling.  EXTREMITIES: Without clubbing, cyanosis, or edema.  NEUROLOGIC: No motor or sensory deficits. Steady, even gait. C2-C12 intact.  PSYCH/MENTAL STATUS: Alert, oriented x 3. Cooperative, appropriate mood and affect.      Results for orders placed or performed in visit on 12/19/23  POCT URINALYSIS DIP (CLINITEK)   Collection Time: 12/19/23  3:01 PM  Result Value Ref Range   Color, UA yellow yellow   Clarity, UA clear clear   Glucose, UA negative negative mg/dL   Bilirubin, UA negative negative   Ketones, POC UA trace (5) (A) negative mg/dL   Spec Grav, UA >=1.914 (A) 1.010 - 1.025   Blood, UA trace-intact (A) negative   pH, UA 6.0 5.0 - 8.0   POC PROTEIN,UA =30 (A) negative, trace    Urobilinogen, UA 0.2 0.2 or 1.0 E.U./dL   Nitrite, UA Negative Negative   Leukocytes, UA Negative Negative    Assessment & Plan:  1. Benign prostatic hyperplasia with urinary frequency (Primary) Will assess for prostatic enlargement versus malignancy with PSA and assess renal function with BMP.  No signs of UTI on urine dip in office.  Will recheck after starting medication for BPH with urinary frequency to evaluate for trace hemolysis due to trace blood present in the urine today.  I will start patient at 0.4 mg of Flomax and increase to 0.8 mg if no improvement of symptoms and refer to urology if needed.  Patient verbalized understanding and will follow-up as scheduled  - Basic metabolic panel - PSA - Ambulatory referral to Urology - POCT URINALYSIS DIP (CLINITEK) - tamsulosin (FLOMAX) 0.4 MG  CAPS capsule; Take 1 capsule (0.4 mg total) by mouth daily.  Dispense: 30 capsule; Refill: 3  Meds ordered this encounter  Medications   tamsulosin (FLOMAX) 0.4 MG CAPS capsule    Sig: Take 1 capsule (0.4 mg total) by mouth daily.    Dispense:  30 capsule    Refill:  3    Supervising Provider:   DE Peru, RAYMOND J [[id number]]   Lab Orders         Basic metabolic panel         PSA         POCT URINALYSIS DIP (CLINITEK)     Return for As Scheduled for Follow up .    Patient to reach out to office if new, worrisome, or unresolved symptoms arise or if no improvement in patient's condition. Patient verbalized understanding and is agreeable to treatment plan. All questions answered to patient's satisfaction.    Hilbert Bible, [place]

## 2023-12-20 LAB — BASIC METABOLIC PANEL
BUN/Creatinine Ratio: 28 — ABNORMAL HIGH (ref 10–24)
BUN: 24 mg/dL (ref 8–27)
CO2: 22 mmol/L (ref 20–29)
Calcium: 9.7 mg/dL (ref 8.6–10.2)
Chloride: 106 mmol/L (ref 96–106)
Creatinine, Ser: 0.86 mg/dL (ref 0.76–1.27)
Glucose: 119 mg/dL — ABNORMAL HIGH (ref 70–99)
Potassium: 4.1 mmol/L (ref 3.5–5.2)
Sodium: 144 mmol/L (ref 134–144)
eGFR: 89 mL/min/{1.73_m2} (ref >59)

## 2023-12-20 LAB — PSA: Prostate Specific Ag, Serum: 2.6 ng/mL (ref 0.0–4.0)

## 2023-12-20 NOTE — Progress Notes (Signed)
 Renal function and PSA are stable. Will continue with trial of Flomax.

## 2023-12-28 ENCOUNTER — Ambulatory Visit (HOSPITAL_BASED_OUTPATIENT_CLINIC_OR_DEPARTMENT_OTHER): Payer: Self-pay | Admitting: Family Medicine

## 2023-12-29 ENCOUNTER — Encounter (HOSPITAL_BASED_OUTPATIENT_CLINIC_OR_DEPARTMENT_OTHER): Payer: Self-pay | Admitting: Family Medicine

## 2023-12-29 ENCOUNTER — Ambulatory Visit (HOSPITAL_BASED_OUTPATIENT_CLINIC_OR_DEPARTMENT_OTHER): Payer: Self-pay | Admitting: Family Medicine

## 2023-12-29 VITALS — BP 139/73 | HR 75 | Ht 63.0 in | Wt 154.2 lb

## 2023-12-29 DIAGNOSIS — R7989 Other specified abnormal findings of blood chemistry: Secondary | ICD-10-CM

## 2023-12-29 DIAGNOSIS — I10 Essential (primary) hypertension: Secondary | ICD-10-CM

## 2023-12-29 DIAGNOSIS — E782 Mixed hyperlipidemia: Secondary | ICD-10-CM

## 2023-12-29 DIAGNOSIS — R350 Frequency of micturition: Secondary | ICD-10-CM

## 2023-12-29 DIAGNOSIS — E1142 Type 2 diabetes mellitus with diabetic polyneuropathy: Secondary | ICD-10-CM

## 2023-12-29 DIAGNOSIS — N401 Enlarged prostate with lower urinary tract symptoms: Secondary | ICD-10-CM

## 2023-12-29 DIAGNOSIS — M79673 Pain in unspecified foot: Secondary | ICD-10-CM

## 2023-12-29 DIAGNOSIS — E1165 Type 2 diabetes mellitus with hyperglycemia: Secondary | ICD-10-CM

## 2023-12-29 DIAGNOSIS — Z862 Personal history of diseases of the blood and blood-forming organs and certain disorders involving the immune mechanism: Secondary | ICD-10-CM

## 2023-12-29 LAB — POCT UA - MICROALBUMIN
Albumin/Creatinine Ratio, Urine, POC: <30
Creatinine, POC: 100 mg/dL
Microalbumin Ur, POC: 30 mg/L

## 2023-12-29 MED ORDER — TAMSULOSIN HCL 0.4 MG PO CAPS: 0.8000 mg | ORAL_CAPSULE | Freq: Every day | ORAL | 3 refills | Status: DC

## 2023-12-29 NOTE — Patient Instructions (Addendum)
 Please obtain Arch Supports for your shoes (Dr. Margart Sickles).   Please obtain Shingrix and Pneumonia vaccines from pharmacy.    Increase your Flomax (tamsulosin) to 2 capsules daily.    Glucosamine, Chondroitin - supplement for joint pain

## 2023-12-29 NOTE — Progress Notes (Signed)
 Subjective:   Nathaniel Parks Jan 15, 1946 12/29/2023  Chief Complaint  Patient presents with   Hypertension    4-week follow up for HTN; states that things are getting better with the urination. Pt also states when he walks, he feels like the ligaments in his legs feel tight and also has problems with his knee when ambulating.    HPI: Nathaniel Parks presents today for re-assessment and management of chronic medical conditions.    HYPERTENSION: Nathaniel Parks presents for the medical management of hypertension.  Patient's current hypertension medication regimen is: Losartan 25mg  Patient is  currently taking prescribed medications for HTN.  Patient is not regularly keeping a check on BP at home.  Adhering to low sodium diet: Yes Exercising Regularly: no Denies headache, dizziness, CP, SHOB, vision changes.   BP Readings from Last 3 Encounters:  12/29/23 139/73  12/19/23 129/82  08/31/23 (!) 151/90    BPH: Patient previously started on Flomax 0.4mg  every day on 12/19/2023 due to increased urinary frequency, specifically nocturia and urgency. He has noticed mild improvement with urgency. He is still waking up frequently during the night to void but reports improvement with urinary stream. He was previously referred to Urology for BPH and has an appt on 01/18/2024 w/ Dr. Pete Glatter.     HYPERLIPIDEMIA: Nathaniel Parks presents for the medical management of hyperlipidemia.  Patient's current HLD regimen is: Atorvastatin 40mg  Patient is  currently taking prescribed medications for HLD.  Adhering to heathy diet: Yes Exercising regularly: No Denies myalgias.  Lab Results  Component Value Date   CHOL 169 12/29/2023   HDL 48 12/29/2023   LDLCALC 100 (H) 12/29/2023   TRIG 115 12/29/2023   CHOLHDL 3.5 12/29/2023   DIABETES MELLITUS: Nathaniel Parks presents for the medical management of diabetes.  Current diabetes medication regimen: metformin 500mg  every day  Patient  is  adhering to a diabetic diet.  Patient is  exercising regularly.  Patient is not checking BS regularly. Patient is  checking their feet regularly.  Denies polydipsia, polyphagia, polyuria, open wounds or ulcers on feet.  Lab Results  Component Value Date   HGBA1C 6.5 (H) 12/29/2023    Foot Exam: 12/29/2023 Lab Results  Component Value Date   MICROALBUR 30 12/29/2023    Wt Readings from Last 3 Encounters:  12/29/23 154 lb 3.2 oz (69.9 kg)  12/19/23 155 lb 9.6 oz (70.6 kg)  08/31/23 161 lb 8 oz (73.3 kg)    LOWER EXTREMITY CONCERN:  Patient states he has a "pulling sensation" to plantar surfaces of feet ongoing for several months. He states pain worsens with walking. He does report mild numbness and tingling to bilateral feet intermittently. Denies weakness, swelling, rash, or discoloration. Patient is a diabetic.      The following portions of the patient's history were reviewed and updated as appropriate: past medical history, past surgical history, family history, social history, allergies, medications, and problem list.   Patient Active Problem List   Diagnosis Date Noted   Arch pain, unspecified laterality 12/31/2023   History of anemia 12/29/2023   Mixed hyperlipidemia 12/29/2023   Elevated TSH 12/29/2023   Benign prostatic hyperplasia with urinary frequency 12/29/2023   Unilateral primary osteoarthritis, left knee 10/11/2023   Pre-operative clearance 08/31/2023   Primary hypertension 08/31/2023   Type 2 diabetes mellitus with hyperglycemia (HCC)    Olecranon bursitis, left elbow 03/14/2023   Septic prepatellar bursitis of right knee 05/18/2020   Past Medical  History:  Diagnosis Date   BPH (benign prostatic hyperplasia)    Elevated BP without diagnosis of hypertension    Pre-diabetes    Past Surgical History:  Procedure Laterality Date   I & D EXTREMITY Right 05/21/2020   Procedure: IRRIGATION AND DEBRIDEMENT EXTREMITY;  Surgeon: Bjorn Pippin, MD;  [facility];  Service: Orthopedics;  Laterality: Right;   IRRIGATION AND DEBRIDEMENT KNEE Right 05/19/2020   Procedure: IRRIGATION AND DEBRIDEMENT KNEE;  Surgeon: Teryl Lucy, MD;  Location: [facility];  Service: Orthopedics;  Laterality: Right;   KNEE ARTHROSCOPY Right    KNEE BURSECTOMY Right 05/19/2020   Procedure: KNEE BURSECTOMY;  Surgeon: Teryl Lucy, MD;  Location: [facility];  Service: Orthopedics;  Laterality: Right;   KNEE BURSECTOMY Right 05/21/2020   Procedure: KNEE BURSECTOMY;  Surgeon: Bjorn Pippin, MD;  Location: [facility];  Service: Orthopedics;  Laterality: Right;   History reviewed. No pertinent family history. Outpatient Medications Prior to Visit  Medication Sig Dispense Refill   atorvastatin (LIPITOR) 40 MG tablet Take 1 tablet (40 mg total) by mouth at bedtime. 90 tablet 3   losartan (COZAAR) 25 MG tablet Take 0.5 tablets (12.5 mg total) by mouth daily. 30 tablet 3   metFORMIN (GLUCOPHAGE-XR) 500 MG 24 hr tablet Take 1 tablet (500 mg total) by mouth daily with breakfast. 90 tablet 3   Misc Natural Products (PROSTATE PO) Take 1 capsule by mouth daily.     Omega-3 Fatty Acids (OMEGA-3 FISH OIL PO) Take 1 capsule by mouth daily.     tamsulosin (FLOMAX) 0.4 MG CAPS capsule Take 1 capsule (0.4 mg total) by mouth daily. 30 capsule 3   No facility-administered medications prior to visit.   No Known Allergies   ROS: A complete ROS was performed with pertinent positives/negatives noted in the HPI. The remainder of the ROS are negative.    Objective:   Today's Vitals   12/29/23 1014  BP: 139/73  Pulse: 75  SpO2: 99%  Weight: 154 lb 3.2 oz (69.9 kg)  Height: 5\' 3"  (1.6 m)    Physical Exam          GENERAL: Well-appearing, in NAD. Well nourished.  SKIN: Pink, warm and dry.  Head: Normocephalic. NECK: Trachea midline. Full ROM w/o pain or tenderness.  RESPIRATORY: Chest wall symmetrical. Respirations even and non-labored.  MSK: Muscle tone and strength appropriate for age.   EXTREMITIES: Without clubbing, cyanosis, or edema.  NEUROLOGIC: No motor or sensory deficits. Steady, even gait. C2-C12 intact.  PSYCH/MENTAL STATUS: Alert, oriented x 3. Cooperative, appropriate mood and affect.   Diabetic Foot Exam - Simple   Simple Foot Form Diabetic Foot exam was performed with the following findings: Yes 12/29/2023 10:40 AM  Visual Inspection See comments: Yes Sensation Testing See comments: Yes Pulse Check Posterior Tibialis and Dorsalis pulse intact bilaterally: Yes Comments Toenail thickening present to nails of right foot. Mild hallux valgus present to medial aspect of first toe on right foot. Decreased sensation present to plantar surface of right heel.       Health Maintenance Due  Topic Date Due   Pneumonia Vaccine 58+ Years old (1 of 2 - PCV) Never done   OPHTHALMOLOGY EXAM  Never done   Zoster Vaccines- Shingrix (1 of 2) Never done    Results for orders placed or performed in visit on 12/29/23  Lipid panel  Result Value Ref Range   Cholesterol, Total 169 100 - 199 mg/dL   Triglycerides 161 0 -  149 mg/dL   HDL 48 >16 mg/dL   VLDL Cholesterol Cal 21 5 - 40 mg/dL   LDL Chol Calc (NIH) 109 (H) 0 - 99 mg/dL   Chol/HDL Ratio 3.5 0.0 - 5.0 ratio  CBC with Differential/Platelet  Result Value Ref Range   WBC 5.8 3.4 - 10.8 x10E3/uL   RBC 4.42 4.14 - 5.80 x10E6/uL   Hemoglobin 13.2 13.0 - 17.7 g/dL   Hematocrit 60.4 54.0 - 51.0 %   MCV 91 79 - 97 fL   MCH 29.9 26.6 - 33.0 pg   MCHC 32.8 31.5 - 35.7 g/dL   RDW 98.1 19.1 - 47.8 %   Platelets 206 150 - 450 x10E3/uL   Neutrophils 61 Not Estab. %   Lymphs 29 Not Estab. %   Monocytes 7 Not Estab. %   Eos 2 Not Estab. %   Basos 1 Not Estab. %   Neutrophils Absolute 3.6 1.4 - 7.0 x10E3/uL   Lymphocytes Absolute 1.7 0.7 - 3.1 x10E3/uL   Monocytes Absolute 0.4 0.1 - 0.9 x10E3/uL   EOS (ABSOLUTE) 0.1 0.0 - 0.4 x10E3/uL   Basophils Absolute 0.0 0.0 - 0.2 x10E3/uL   Immature Granulocytes 0 Not Estab. %    Immature Grans (Abs) 0.0 0.0 - 0.1 x10E3/uL  Hemoglobin A1c  Result Value Ref Range   Hgb A1c MFr Bld 6.5 (H) 4.8 - 5.6 %   Est. average glucose Bld gHb Est-mCnc 140 mg/dL  TSH Rfx on Abnormal to Free T4  Result Value Ref Range   TSH 5.360 (H) 0.450 - 4.500 uIU/mL  T4F  Result Value Ref Range   T4,Free (Direct) 1.28 0.82 - 1.77 ng/dL  POCT UA - Microalbumin  Result Value Ref Range   Microalbumin Ur, POC 30 mg/L   Creatinine, POC 100 mg/dL   Albumin/Creatinine Ratio, Urine, POC <30     The 10-year ASCVD risk score (Arnett DK, et al., 2019) is: 58.8%     Assessment & Plan:  1. Elevated TSH (Primary) Previously elevated. Asymptomatic for thyroid disease. Will check TSH w/ labs today.  - TSH Rfx on Abnormal to Free T4  2. Benign prostatic hyperplasia with urinary frequency Improving. Increase Flomax to 0.8mg  daily. Discussed ways to decrease nighttime micturition with reducing fluid intake close to bedtime.  - tamsulosin (FLOMAX) 0.4 MG CAPS capsule; Take 2 capsules (0.8 mg total) by mouth daily.  Dispense: 60 capsule; Refill: 3  3. Primary hypertension Controlled. Continue current regimen.   4. Mixed hyperlipidemia Assess control with LP today. Discussed healthy lifestyle with heart healthy diet, regular exercise.  - Lipid panel  5. Type 2 diabetes mellitus with hyperglycemia, without long-term current use of insulin (HCC) Assess control with A1C, urine albumin today. Given thickening of toenails, recommend nail care by Podiatry for prevention of infection and referral placed. Discussed foot care, complications of diabetes including diabetic neuropathy. Recommend possible use of Gabapentin. Agreeable to evaluate by Podiatry first and referral placed.  - Hemoglobin A1c - Ambulatory referral to Podiatry - POCT UA - Microalbumin  6. History of anemia No signs of acute bleeding and reports likely diet related anemia. Reviewed diet and will check CBC with labs today.  - CBC  with Differential/Platelet   Meds ordered this encounter  Medications   tamsulosin (FLOMAX) 0.4 MG CAPS capsule    Sig: Take 2 capsules (0.8 mg total) by mouth daily.    Dispense:  60 capsule    Refill:  3    Supervising Provider:  DE [place], RAYMOND J [[id number]]   Lab Orders         Lipid panel         CBC with Differential/Platelet         Hemoglobin A1c         TSH Rfx on Abnormal to Free T4         T4F         POCT UA - Microalbumin     No images are attached to the encounter or orders placed in the encounter.  Return in about 4 months (around 04/29/2024) for DIABETES CHECK UP.    Patient to reach out to office if new, worrisome, or unresolved symptoms arise or if no improvement in patient's condition. Patient verbalized understanding and is agreeable to treatment plan. All questions answered to patient's satisfaction.    Hilbert Bible, [place]

## 2023-12-30 LAB — TSH RFX ON ABNORMAL TO FREE T4: TSH: 5.36 u[IU]/mL — ABNORMAL HIGH (ref 0.450–4.500)

## 2023-12-30 LAB — CBC WITH DIFFERENTIAL/PLATELET
Basophils Absolute: 0 10*3/uL (ref 0.0–0.2)
Basos: 1 %
EOS (ABSOLUTE): 0.1 10*3/uL (ref 0.0–0.4)
Eos: 2 %
Hematocrit: 40.2 % (ref 37.5–51.0)
Hemoglobin: 13.2 g/dL (ref 13.0–17.7)
Immature Grans (Abs): 0 10*3/uL (ref 0.0–0.1)
Immature Granulocytes: 0 %
Lymphocytes Absolute: 1.7 10*3/uL (ref 0.7–3.1)
Lymphs: 29 %
MCH: 29.9 pg (ref 26.6–33.0)
MCHC: 32.8 g/dL (ref 31.5–35.7)
MCV: 91 fL (ref 79–97)
Monocytes Absolute: 0.4 10*3/uL (ref 0.1–0.9)
Monocytes: 7 %
Neutrophils Absolute: 3.6 10*3/uL (ref 1.4–7.0)
Neutrophils: 61 %
Platelets: 206 10*3/uL (ref 150–450)
RBC: 4.42 x10E6/uL (ref 4.14–5.80)
RDW: 12.8 % (ref 11.6–15.4)
WBC: 5.8 10*3/uL (ref 3.4–10.8)

## 2023-12-30 LAB — LIPID PANEL
Chol/HDL Ratio: 3.5 ratio (ref 0.0–5.0)
Cholesterol, Total: 169 mg/dL (ref 100–199)
HDL: 48 mg/dL (ref >39)
LDL Chol Calc (NIH): 100 mg/dL — ABNORMAL HIGH (ref 0–99)
Triglycerides: 115 mg/dL (ref 0–149)
VLDL Cholesterol Cal: 21 mg/dL (ref 5–40)

## 2023-12-30 LAB — HEMOGLOBIN A1C
Est. average glucose Bld gHb Est-mCnc: 140 mg/dL
Hgb A1c MFr Bld: 6.5 % — ABNORMAL HIGH (ref 4.8–5.6)

## 2023-12-30 LAB — T4F: T4,Free (Direct): 1.28 ng/dL (ref 0.82–1.77)

## 2023-12-31 DIAGNOSIS — M79673 Pain in unspecified foot: Secondary | ICD-10-CM | POA: Insufficient documentation

## 2024-01-02 ENCOUNTER — Ambulatory Visit (INDEPENDENT_AMBULATORY_CARE_PROVIDER_SITE_OTHER): Payer: Self-pay | Admitting: Podiatry

## 2024-01-02 ENCOUNTER — Encounter: Payer: Self-pay | Admitting: Podiatry

## 2024-01-02 DIAGNOSIS — L603 Nail dystrophy: Secondary | ICD-10-CM

## 2024-01-02 DIAGNOSIS — E119 Type 2 diabetes mellitus without complications: Secondary | ICD-10-CM

## 2024-01-03 ENCOUNTER — Encounter (HOSPITAL_BASED_OUTPATIENT_CLINIC_OR_DEPARTMENT_OTHER): Payer: Self-pay | Admitting: Family Medicine

## 2024-01-03 NOTE — Progress Notes (Signed)
 Hi Nathaniel Parks, Your cholesterol has significantly improved from 4 months ago.  We will continue on the current dosage of cholesterol medication.  Your A1c has also improved and is now under the goal of less than 7 at 6.5.  We will continue on your current dose of metformin without any changes.  Your blood counts are normal.  Your TSH is still slightly elevated, but your T4 or active thyroid hormone is normal.  This can be common as we age and we do not treat with medications.  We will continue to monitor.  If you have any questions please let me know

## 2024-01-04 NOTE — Progress Notes (Signed)
  Subjective:  Patient ID: Nathaniel Parks, male    DOB: 09/15/1946,  MRN: [id number]  Chief Complaint  Patient presents with   Nail Problem    Patient states that he is here to get his toe nail trimmed down and diabetic foot exam.     78 y.o. male presents with the above complaint. History confirmed with patient.  He notes symptoms around the forefoot like his foot is very tight.  A family member is present and helps with translation today.  He declined the use of a Nurse, learning disability.  A1c 6.5%  Objective:  Physical Exam: warm, good capillary refill, no trophic changes or ulcerative lesions, normal DP and PT pulses, normal sensory exam, and inconsistent monofilament exam his nails have some dystrophy but do not have significant onychomycosis they are not painful.  Assessment:   1. Nail dystrophy   2. Encounter for diabetic foot exam [facility])      Plan:  Patient was evaluated and treated and all questions answered.  Patient educated on diabetes. Discussed proper diabetic foot care and discussed risks and complications of disease. Educated patient in depth on reasons to return to the office immediately should he/she discover anything concerning or new on the feet. All questions answered. Discussed proper shoes as well.   His A1c is well-controlled, okay for continuing nail care on his own.  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  Follow-up with me as needed.  Return if symptoms worsen or fail to improve.

## 2024-01-17 ENCOUNTER — Ambulatory Visit (HOSPITAL_BASED_OUTPATIENT_CLINIC_OR_DEPARTMENT_OTHER): Payer: Self-pay | Admitting: Family Medicine

## 2024-01-18 ENCOUNTER — Ambulatory Visit: Payer: Self-pay | Admitting: Urology

## 2024-01-18 NOTE — Progress Notes (Deleted)
   Assessment: 1. BPH with obstruction/lower urinary tract symptoms      Plan: I personally reviewed the patient's chart including provider notes, and lab results.   Chief Complaint: No chief complaint on file.   History of Present Illness:  Nathaniel Parks is a 78 y.o. male who is seen in consultation from [place], Shelton Silvas, FNP for evaluation of BPH with LUTS.  PSA 2/25:  2.6  Past Medical History:  Past Medical History:  Diagnosis Date   BPH (benign prostatic hyperplasia)    Elevated BP without diagnosis of hypertension    Pre-diabetes     Past Surgical History:  Past Surgical History:  Procedure Laterality Date   I & D EXTREMITY Right 05/21/2020   Procedure: IRRIGATION AND DEBRIDEMENT EXTREMITY;  Surgeon: Bjorn Pippin, MD;  Location: [facility];  Service: Orthopedics;  Laterality: Right;   IRRIGATION AND DEBRIDEMENT KNEE Right 05/19/2020   Procedure: IRRIGATION AND DEBRIDEMENT KNEE;  Surgeon: Teryl Lucy, MD;  Location: [facility];  Service: Orthopedics;  Laterality: Right;   KNEE ARTHROSCOPY Right    KNEE BURSECTOMY Right 05/19/2020   Procedure: KNEE BURSECTOMY;  Surgeon: Teryl Lucy, MD;  [facility]: [facility];  Service: Orthopedics;  Laterality: Right;   KNEE BURSECTOMY Right 05/21/2020   Procedure: KNEE BURSECTOMY;  Surgeon: Bjorn Pippin, MD;  Location: [facility];  Service: Orthopedics;  Laterality: Right;    Allergies:  No Known Allergies  Family History:  No family history on file.  Social History:  Social History   Tobacco Use   Smoking status: Never    Passive exposure: Never   Smokeless tobacco: Never  Vaping Use   Vaping status: Never Used  Substance Use Topics   Alcohol use: Not Currently   Drug use: Never    Review of symptoms:  Constitutional:  Negative for unexplained weight loss, night sweats, fever, chills ENT:  Negative for nose bleeds, sinus pain, painful swallowing CV:  Negative for chest pain, shortness of breath, exercise intolerance,  palpitations, loss of consciousness Resp:  Negative for cough, wheezing, shortness of breath GI:  Negative for nausea, vomiting, diarrhea, bloody stools GU:  Positives noted in HPI; otherwise negative for gross hematuria, dysuria, urinary incontinence Neuro:  Negative for seizures, poor balance, limb weakness, slurred speech Psych:  Negative for lack of energy, depression, anxiety Endocrine:  Negative for polydipsia, polyuria, symptoms of hypoglycemia (dizziness, hunger, sweating) Hematologic:  Negative for anemia, purpura, petechia, prolonged or excessive bleeding, use of anticoagulants  Allergic:  Negative for difficulty breathing or choking as a result of exposure to anything; no shellfish allergy; no allergic response (rash/itch) to materials, foods  Physical exam: There were no vitals taken for this visit. GENERAL APPEARANCE:  Well appearing, well developed, well nourished, NAD HEENT: Atraumatic, Normocephalic, oropharynx clear. NECK: Supple without lymphadenopathy or thyromegaly. LUNGS: Clear to auscultation bilaterally. HEART: Regular Rate and Rhythm without murmurs, gallops, or rubs. ABDOMEN: Soft, non-tender, No Masses. EXTREMITIES: Moves all extremities well.  Without clubbing, cyanosis, or edema. NEUROLOGIC:  Alert and oriented x 3, normal gait, CN II-XII grossly intact.  MENTAL STATUS:  Appropriate. BACK:  Non-tender to palpation.  No CVAT SKIN:  Warm, dry and intact.   GU: Penis:  {Exam; penis:5791} Meatus: {Meatus:15530} Scrotum: {pe scrotum:310183} Testis: {Exam; testicles:5790} Epididymis: {epididymis GNFA:213086} Prostate: {Exam; prostate:5793} Rectum: {rectal exam:26517}  Results: U/A:  PVR:

## 2024-02-02 ENCOUNTER — Ambulatory Visit: Payer: Self-pay | Admitting: Urology

## 2024-02-02 ENCOUNTER — Encounter: Payer: Self-pay | Admitting: Urology

## 2024-02-02 VITALS — BP 128/79 | HR 67 | Ht 63.0 in | Wt 154.0 lb

## 2024-02-02 DIAGNOSIS — N401 Enlarged prostate with lower urinary tract symptoms: Secondary | ICD-10-CM

## 2024-02-02 DIAGNOSIS — N3281 Overactive bladder: Secondary | ICD-10-CM

## 2024-02-02 DIAGNOSIS — R8290 Unspecified abnormal findings in urine: Secondary | ICD-10-CM

## 2024-02-02 DIAGNOSIS — N138 Other obstructive and reflux uropathy: Secondary | ICD-10-CM

## 2024-02-02 LAB — URINALYSIS, ROUTINE W REFLEX MICROSCOPIC
Bilirubin, UA: NEGATIVE
Glucose, UA: NEGATIVE
Ketones, UA: NEGATIVE
Nitrite, UA: NEGATIVE
Protein,UA: NEGATIVE
Specific Gravity, UA: 1.01 (ref 1.005–1.030)
Urobilinogen, Ur: 0.2 mg/dL (ref 0.2–1.0)
pH, UA: 6.5 (ref 5.0–7.5)

## 2024-02-02 LAB — MICROSCOPIC EXAMINATION: Bacteria, UA: NONE SEEN

## 2024-02-02 LAB — BLADDER SCAN AMB NON-IMAGING

## 2024-02-02 MED ORDER — SOLIFENACIN SUCCINATE 5 MG PO TABS: 5.0000 mg | ORAL_TABLET | Freq: Every day | ORAL | 11 refills | Status: AC

## 2024-02-02 NOTE — Progress Notes (Signed)
 Assessment: 1. Overactive bladder   2. BPH with obstruction/lower urinary tract symptoms   3. Abnormal urine findings     Plan: I personally reviewed the patient's chart including provider notes, and lab results. Urine culture sent today for evaluation of abnormal urine findings. Continue tamsulosin. Discussed avoidance of dietary irritants to reduce OAB symptoms. Trial of solifenacin 5 mg daily.  Prescription sent.  Use and side effects discussed. Return to office in 4-6 weeks  Chief Complaint:  Chief Complaint  Patient presents with   Benign Prostatic Hypertrophy    History of Present Illness:  Nathaniel Parks is a 78 y.o. male who is seen in consultation from [place], Shelton Silvas, FNP for evaluation of BPH with LUTS. He he has a history of urinary frequency, urgency, nocturia x 5, and urge incontinence.  He reports voiding with a good stream.  No dysuria or gross hematuria.  His symptoms have been present for 3 years.  He was started on tamsulosin in February 2025.  He has noted some slight improvement in his urinary symptoms with the medication.  IPSS = 14/4 today.  PSA 2/25:  2.6  An interpreter was used during the visit.  Past Medical History:  Past Medical History:  Diagnosis Date   BPH (benign prostatic hyperplasia)    Elevated BP without diagnosis of hypertension    Pre-diabetes     Past Surgical History:  Past Surgical History:  Procedure Laterality Date   I & D EXTREMITY Right 05/21/2020   Procedure: IRRIGATION AND DEBRIDEMENT EXTREMITY;  Surgeon: Bjorn Pippin, MD;  Location: [facility];  Service: Orthopedics;  Laterality: Right;   IRRIGATION AND DEBRIDEMENT KNEE Right 05/19/2020   Procedure: IRRIGATION AND DEBRIDEMENT KNEE;  Surgeon: Teryl Lucy, MD;  Location: [facility];  Service: Orthopedics;  Laterality: Right;   KNEE ARTHROSCOPY Right    KNEE BURSECTOMY Right 05/19/2020   Procedure: KNEE BURSECTOMY;  Surgeon: Teryl Lucy, MD;  Location: [facility];   Service: Orthopedics;  Laterality: Right;   KNEE BURSECTOMY Right 05/21/2020   Procedure: KNEE BURSECTOMY;  Surgeon: Bjorn Pippin, MD;  Location: [facility];  Service: Orthopedics;  Laterality: Right;    Allergies:  No Known Allergies  Family History:  No family history on file.  Social History:  Social History   Tobacco Use   Smoking status: Never    Passive exposure: Never   Smokeless tobacco: Never  Vaping Use   Vaping status: Never Used  Substance Use Topics   Alcohol use: Not Currently   Drug use: Never    Review of symptoms:  Constitutional:  Negative for unexplained weight loss, night sweats, fever, chills ENT:  Negative for nose bleeds, sinus pain, painful swallowing CV:  Negative for chest pain, shortness of breath, exercise intolerance, palpitations, loss of consciousness Resp:  Negative for cough, wheezing, shortness of breath GI:  Negative for nausea, vomiting, diarrhea, bloody stools GU:  Positives noted in HPI; otherwise negative for gross hematuria, dysuria Neuro:  Negative for seizures, poor balance, limb weakness, slurred speech Psych:  Negative for lack of energy, depression, anxiety Endocrine:  Negative for polydipsia, polyuria, symptoms of hypoglycemia (dizziness, hunger, sweating) Hematologic:  Negative for anemia, purpura, petechia, prolonged or excessive bleeding, use of anticoagulants  Allergic:  Negative for difficulty breathing or choking as a result of exposure to anything; no shellfish allergy; no allergic response (rash/itch) to materials, foods  Physical exam: BP 128/79   Pulse 67   Ht 5\' 3"  (1.6 m)  Wt 154 lb (69.9 kg)   BMI 27.28 kg/m  GENERAL APPEARANCE:  Well appearing, well developed, well nourished, NAD HEENT: Atraumatic, Normocephalic, oropharynx clear. NECK: Supple without lymphadenopathy or thyromegaly. LUNGS: Clear to auscultation bilaterally. HEART: Regular Rate and Rhythm without murmurs, gallops, or rubs. ABDOMEN: Soft,  non-tender, No Masses. EXTREMITIES: Moves all extremities well.  Without clubbing, cyanosis, or edema. NEUROLOGIC:  Alert and oriented x 3, normal gait, CN II-XII grossly intact.  MENTAL STATUS:  Appropriate. BACK:  Non-tender to palpation.  No CVAT SKIN:  Warm, dry and intact.   GU: Penis:  uncircumcised Meatus: Normal Scrotum: normal, no masses Testis: normal without masses bilateral Epididymis: normal Prostate: 40 g, NT, no nodules Rectum: Normal tone,  no masses or tenderness  Results: U/A:  6-10 WBC, 11-30 RBC  PVR: 23 ml

## 2024-02-04 LAB — URINE CULTURE

## 2024-02-05 ENCOUNTER — Telehealth: Payer: Self-pay

## 2024-02-05 NOTE — Telephone Encounter (Signed)
 Attempted to reach pt at all numbers listed in chart, no answer. No DPR on file, unable to leave a message. Will attempt pt again.

## 2024-02-05 NOTE — Telephone Encounter (Signed)
-----   Message from Oda Bence sent at 02/05/2024  1:10 PM EDT ----- Please notify patient that his urine culture did not show evidence of a UTI.  No antibiotics indicated at this time.

## 2024-02-07 NOTE — Telephone Encounter (Signed)
 Using interpreter services, interpreter # [phone], attempted to reach pt again, no answer, unable to leave a message due to no DPR on file.

## 2024-03-01 ENCOUNTER — Other Ambulatory Visit (HOSPITAL_BASED_OUTPATIENT_CLINIC_OR_DEPARTMENT_OTHER): Payer: Self-pay | Admitting: Family Medicine

## 2024-03-15 ENCOUNTER — Ambulatory Visit: Payer: Self-pay | Admitting: Urology

## 2024-03-15 ENCOUNTER — Encounter: Payer: Self-pay | Admitting: Urology

## 2024-03-15 VITALS — BP 130/77 | HR 74 | Ht 62.0 in | Wt 142.0 lb

## 2024-03-15 DIAGNOSIS — R3129 Other microscopic hematuria: Secondary | ICD-10-CM | POA: Insufficient documentation

## 2024-03-15 DIAGNOSIS — N3281 Overactive bladder: Secondary | ICD-10-CM

## 2024-03-15 DIAGNOSIS — N401 Enlarged prostate with lower urinary tract symptoms: Secondary | ICD-10-CM

## 2024-03-15 DIAGNOSIS — N138 Other obstructive and reflux uropathy: Secondary | ICD-10-CM

## 2024-03-15 LAB — MICROSCOPIC EXAMINATION: RBC, Urine: >30 /HPF — AB (ref 0–2)

## 2024-03-15 LAB — URINALYSIS, ROUTINE W REFLEX MICROSCOPIC
Bilirubin, UA: NEGATIVE
Glucose, UA: NEGATIVE
Nitrite, UA: NEGATIVE
Protein,UA: NEGATIVE
Specific Gravity, UA: 1.01 (ref 1.005–1.030)
Urobilinogen, Ur: 0.2 mg/dL (ref 0.2–1.0)
pH, UA: 6 (ref 5.0–7.5)

## 2024-03-15 LAB — BLADDER SCAN AMB NON-IMAGING

## 2024-03-15 NOTE — Progress Notes (Signed)
 Assessment: 1. BPH with obstruction/lower urinary tract symptoms   2. Overactive bladder   3. Microscopic hematuria     Plan: Continue tamsulosin . Continue solifenacin  5 mg daily.   Today I had a discussion with the patient regarding the findings of microscopic hematuria including the implications and differential diagnoses associated with it.  I also discussed recommendations for further evaluation including the rationale for upper tract imaging and cystoscopy.  I discussed the nature of these procedures including potential risk and complications.  The patient expressed an understanding of these issues. CT hematuria protocol followed by cystoscopy  Chief Complaint:  Chief Complaint  Patient presents with   Benign Prostatic Hypertrophy   Over Active Bladder    History of Present Illness:  Nathaniel Parks is a 78 y.o. male who is seen for further evaluation of BPH with LUTS. He has a history of urinary frequency, urgency, nocturia x 5, and urge incontinence.  He reported voiding with a good stream.  No dysuria or gross hematuria.  His symptoms have been present for 3 years.  He was started on tamsulosin  in February 2025.  He noted some slight improvement in his urinary symptoms with the medication.  IPSS = 14/4. PVR = 23 mL  PSA 2/25:  2.6  U/A from 02/02/24 showed 11-30 RBC, 6-10 WBC.   Urine culture from 4/25 grew <10K mixed flora. He was given a trial of solifenacin  5 mg daily in April 2025.  He returns today for follow-up.  He continues on tamsulosin  and solifenacin .  He has noted significant improvement in his urinary symptoms with the solifenacin .  He is not having any problems with frequency, urgency or urge incontinence.  His nocturia has decreased to 1 time per night.  No side effects from the medication. No dysuria or gross hematuria. IPSS = 1/1 today.  An interpreter was used during the visit.  Past Medical History:  Past Medical History:  Diagnosis Date   BPH  (benign prostatic hyperplasia)    Elevated BP without diagnosis of hypertension    Pre-diabetes     Past Surgical History:  Past Surgical History:  Procedure Laterality Date   I & D EXTREMITY Right 05/21/2020   Procedure: IRRIGATION AND DEBRIDEMENT EXTREMITY;  Surgeon: Micheline Ahr, MD;  Location: [facility] OR;  Service: Orthopedics;  Laterality: Right;   IRRIGATION AND DEBRIDEMENT KNEE Right 05/19/2020   Procedure: IRRIGATION AND DEBRIDEMENT KNEE;  Surgeon: Osa Blase, MD;  Location: [facility] OR;  Service: Orthopedics;  Laterality: Right;   KNEE ARTHROSCOPY Right    KNEE BURSECTOMY Right 05/19/2020   Procedure: KNEE BURSECTOMY;  Surgeon: Osa Blase, MD;  Location: [facility];  Service: Orthopedics;  Laterality: Right;   KNEE BURSECTOMY Right 05/21/2020   Procedure: KNEE BURSECTOMY;  Surgeon: Micheline Ahr, MD;  Location: [facility];  Service: Orthopedics;  Laterality: Right;    Allergies:  No Known Allergies  Family History:  No family history on file.  Social History:  Social History   Tobacco Use   Smoking status: Never    Passive exposure: Never   Smokeless tobacco: Never  Vaping Use   Vaping status: Never Used  Substance Use Topics   Alcohol use: Not Currently   Drug use: Never    ROS: Constitutional:  Negative for fever, chills, weight loss CV: Negative for chest pain, previous MI, hypertension Respiratory:  Negative for shortness of breath, wheezing, sleep apnea, frequent cough GI:  Negative for nausea, vomiting, bloody stool, GERD  Physical  exam: BP 130/77   Pulse 74   Ht 5\' 2"  (1.575 m)   Wt 142 lb (64.4 kg)   BMI 25.97 kg/m  GENERAL APPEARANCE:  Well appearing, well developed, well nourished, NAD HEENT:  Atraumatic, normocephalic, oropharynx clear NECK:  Supple without lymphadenopathy or thyromegaly ABDOMEN:  Soft, non-tender, no masses EXTREMITIES:  Moves all extremities well, without clubbing, cyanosis, or edema NEUROLOGIC:  Alert and oriented x 3, normal gait, CN  II-XII grossly intact MENTAL STATUS:  appropriate BACK:  Non-tender to palpation, No CVAT SKIN:  Warm, dry, and intact  Results: U/A: 6-10 WBC, >30 RBC  PVR =  27 ml

## 2024-03-20 ENCOUNTER — Encounter (HOSPITAL_BASED_OUTPATIENT_CLINIC_OR_DEPARTMENT_OTHER): Payer: Self-pay

## 2024-03-20 ENCOUNTER — Ambulatory Visit (HOSPITAL_BASED_OUTPATIENT_CLINIC_OR_DEPARTMENT_OTHER)
Admission: RE | Admit: 2024-03-20 | Discharge: 2024-03-20 | Disposition: A | Discharge status: Home / Self Care | Payer: Self-pay | Source: Ambulatory Visit | Attending: Urology | Admitting: Urology

## 2024-03-20 DIAGNOSIS — R3129 Other microscopic hematuria: Secondary | ICD-10-CM

## 2024-03-20 LAB — POCT I-STAT CREATININE: Creatinine, Ser: 0.9 mg/dL (ref 0.61–1.24)

## 2024-03-20 MED ORDER — IOHEXOL 300 MG/ML  SOLN
100.0000 mL | Freq: Once | INTRAMUSCULAR | Status: AC | PRN
  Administered 2024-03-20: 125 mL via INTRAVENOUS

## 2024-03-25 ENCOUNTER — Ambulatory Visit: Payer: Self-pay | Admitting: Urology

## 2024-03-25 ENCOUNTER — Telehealth: Payer: Self-pay | Admitting: Urology

## 2024-03-25 NOTE — Telephone Encounter (Signed)
 [facility] wanted to make you aware that this patients results were in due to the findings. Please Advise.

## 2024-03-25 NOTE — Telephone Encounter (Signed)
 Spoke w pt's granddaughter who states pt is unavailable. Due to no DPR being on file, explained to her that I was unable to give her the results. She states she will have pt return call.

## 2024-04-18 ENCOUNTER — Encounter: Payer: Self-pay | Admitting: Urology

## 2024-04-18 ENCOUNTER — Ambulatory Visit: Payer: Self-pay | Admitting: Urology

## 2024-04-18 DIAGNOSIS — N200 Calculus of kidney: Secondary | ICD-10-CM

## 2024-04-18 DIAGNOSIS — Z87442 Personal history of urinary calculi: Secondary | ICD-10-CM

## 2024-04-18 DIAGNOSIS — R3129 Other microscopic hematuria: Secondary | ICD-10-CM

## 2024-04-18 DIAGNOSIS — N201 Calculus of ureter: Secondary | ICD-10-CM

## 2024-04-18 DIAGNOSIS — N3281 Overactive bladder: Secondary | ICD-10-CM

## 2024-04-18 DIAGNOSIS — N401 Enlarged prostate with lower urinary tract symptoms: Secondary | ICD-10-CM

## 2024-04-18 DIAGNOSIS — N138 Other obstructive and reflux uropathy: Secondary | ICD-10-CM

## 2024-04-18 LAB — MICROSCOPIC EXAMINATION: Bacteria, UA: NONE SEEN

## 2024-04-18 LAB — URINALYSIS, ROUTINE W REFLEX MICROSCOPIC
Bilirubin, UA: NEGATIVE
Glucose, UA: NEGATIVE
Ketones, UA: NEGATIVE
Leukocytes,UA: NEGATIVE
Nitrite, UA: NEGATIVE
Protein,UA: NEGATIVE
Specific Gravity, UA: 1.01 (ref 1.005–1.030)
Urobilinogen, Ur: 0.2 mg/dL (ref 0.2–1.0)
pH, UA: 6.5 (ref 5.0–7.5)

## 2024-04-18 MED ORDER — CIPROFLOXACIN HCL 500 MG PO TABS
500.0000 mg | ORAL_TABLET | Freq: Once | ORAL | Status: AC
Start: 2024-04-18 — End: 2024-04-18
  Administered 2024-04-18: 500 mg via ORAL

## 2024-04-18 NOTE — Progress Notes (Signed)
 Assessment: 1. Microscopic hematuria   2. Ureteral calculus, right   3. BPH with obstruction/lower urinary tract symptoms   4. Overactive bladder   5. Nephrolithiasis     Plan: I personally reviewed the CT study from 03/20/2024 with results as noted below. It appears that he passed the ureteral calculus and this was the calculus removed from the urethra today. Stone sent for analysis Cipro x 1 following cystoscopy Continue tamsulosin . Continue solifenacin  5 mg daily.   Return to office in 6 weeks  Chief Complaint:  Chief Complaint  Patient presents with   Hematuria    History of Present Illness:  Nathaniel Parks is a 78 y.o. male who is seen for further evaluation of microscopic hematuria, BPH with LUTS, ureteral calculus, and OAB. He has a history of urinary frequency, urgency, nocturia x 5, and urge incontinence.  He reported voiding with a good stream.  No dysuria or gross hematuria.  His symptoms have been present for 3 years.  He was started on tamsulosin  in February 2025.  He noted some slight improvement in his urinary symptoms with the medication.  IPSS = 14/4. PVR = 23 mL  PSA 2/25:  2.6  U/A from 02/02/24 showed 11-30 RBC, 6-10 WBC.   Urine culture from 4/25 grew <10K mixed flora. He was given a trial of solifenacin  5 mg daily in April 2025.  At his visit in May 2025, he continued on tamsulosin  and solifenacin  with significant improvement in his urinary symptoms with the solifenacin .  He was not having any problems with frequency, urgency or urge incontinence.  His nocturia had decreased to 1 time per night.  No side effects from the medication. No dysuria or gross hematuria. IPSS = 1/1. PVR = 27 mL. U/A: >30 RBCs  CT hematuria study from 03/20/2024 showed a 7 mm calculus at the distal right UVJ with mild right hydronephrosis, multiple small bilateral renal calculi and enlarged prostate.  He noted an episode of some right sided flank pain approximately 2  weeks ago.  He is not aware of passing a stone.  No dysuria or gross hematuria.  He continues on tamsulosin  and solifenacin . He presents today for further evaluation of his microscopic hematuria with cystoscopy.   An interpreter was used during the visit.  Past Medical History:  Past Medical History:  Diagnosis Date   BPH (benign prostatic hyperplasia)    Elevated BP without diagnosis of hypertension    Pre-diabetes     Past Surgical History:  Past Surgical History:  Procedure Laterality Date   I & D EXTREMITY Right 05/21/2020   Procedure: IRRIGATION AND DEBRIDEMENT EXTREMITY;  Surgeon: Cristy Bonner DASEN, MD;  Location: [facility];  Service: Orthopedics;  Laterality: Right;   IRRIGATION AND DEBRIDEMENT KNEE Right 05/19/2020   Procedure: IRRIGATION AND DEBRIDEMENT KNEE;  Surgeon: Josefina Chew, MD;  Location: [facility];  Service: Orthopedics;  Laterality: Right;   KNEE ARTHROSCOPY Right    KNEE BURSECTOMY Right 05/19/2020   Procedure: KNEE BURSECTOMY;  Surgeon: Josefina Chew, MD;  Location: [facility];  Service: Orthopedics;  Laterality: Right;   KNEE BURSECTOMY Right 05/21/2020   Procedure: KNEE BURSECTOMY;  Surgeon: Cristy Bonner DASEN, MD;  Location: [facility];  Service: Orthopedics;  Laterality: Right;    Allergies:  No Known Allergies  Family History:  No family history on file.  Social History:  Social History   Tobacco Use   Smoking status: Never    Passive exposure: Never   Smokeless tobacco:  Never  Vaping Use   Vaping status: Never Used  Substance Use Topics   Alcohol use: Not Currently   Drug use: Never    ROS: Constitutional:  Negative for fever, chills, weight loss CV: Negative for chest pain, previous MI, hypertension Respiratory:  Negative for shortness of breath, wheezing, sleep apnea, frequent cough GI:  Negative for nausea, vomiting, bloody stool, GERD  Physical exam: GENERAL APPEARANCE:  Well appearing, well developed, well nourished, NAD HEENT:  Atraumatic, normocephalic,  oropharynx clear NECK:  Supple without lymphadenopathy or thyromegaly ABDOMEN:  Soft, non-tender, no masses EXTREMITIES:  Moves all extremities well, without clubbing, cyanosis, or edema NEUROLOGIC:  Alert and oriented x 3, normal gait, CN II-XII grossly intact MENTAL STATUS:  appropriate BACK:  Non-tender to palpation, No CVAT SKIN:  Warm, dry, and intact  Results: U/A: 0-5 WBC, 11-30 RBC  Procedure:  Flexible Cystourethroscopy  Pre-operative Diagnosis: Microscopic hematuria  Post-operative Diagnosis: Microscopic hematuria  Anesthesia:  local with lidocaine  jelly  Surgical Narrative:  After appropriate informed consent was obtained, the patient was prepped and draped in the usual sterile fashion in the supine position.  The patient was correctly identified and the proper procedure delineated prior to proceeding.  Sterile lidocaine  gel was instilled in the urethra. The flexible cystoscope was introduced without difficulty.  Findings:  Anterior urethra: Normal  Posterior urethra: calculus in urethra - removed with stone basket; lateral lobe enlargement of prostate  Bladder: no mucosal lesions; 1+ trabeculations  Ureteral orifices: normal  Additional findings: none  Saline bladder wash for cytology was not performed.    The cystoscope was then removed.  The patient tolerated the procedure well.

## 2024-04-19 ENCOUNTER — Other Ambulatory Visit (HOSPITAL_BASED_OUTPATIENT_CLINIC_OR_DEPARTMENT_OTHER): Payer: Self-pay | Admitting: Family Medicine

## 2024-04-19 DIAGNOSIS — N401 Enlarged prostate with lower urinary tract symptoms: Secondary | ICD-10-CM

## 2024-04-25 LAB — STONE ANALYSIS
Calcium Oxalate Dihydrate: 90 %
Calcium Oxalate Monohydrate: 10 %
Weight Calculi: 6 mg

## 2024-06-05 ENCOUNTER — Ambulatory Visit: Payer: Self-pay | Admitting: Urology

## 2024-07-06 ENCOUNTER — Other Ambulatory Visit (HOSPITAL_BASED_OUTPATIENT_CLINIC_OR_DEPARTMENT_OTHER): Payer: Self-pay | Admitting: Family Medicine

## 2024-08-17 ENCOUNTER — Other Ambulatory Visit (HOSPITAL_BASED_OUTPATIENT_CLINIC_OR_DEPARTMENT_OTHER): Payer: Self-pay | Admitting: Family Medicine

## 2024-08-17 DIAGNOSIS — N401 Enlarged prostate with lower urinary tract symptoms: Secondary | ICD-10-CM

## 2024-08-26 ENCOUNTER — Encounter: Payer: Self-pay | Admitting: Radiology

## 2024-09-13 ENCOUNTER — Other Ambulatory Visit (HOSPITAL_BASED_OUTPATIENT_CLINIC_OR_DEPARTMENT_OTHER): Payer: Self-pay | Admitting: Family Medicine

## 2024-10-23 ENCOUNTER — Other Ambulatory Visit (HOSPITAL_BASED_OUTPATIENT_CLINIC_OR_DEPARTMENT_OTHER): Payer: Self-pay | Admitting: Family Medicine

## 2024-10-23 DIAGNOSIS — N401 Enlarged prostate with lower urinary tract symptoms: Secondary | ICD-10-CM
